# Patient Record
Sex: Female | Born: 1955 | Race: Black or African American | Hispanic: No | Marital: Single | Smoking: Never smoker
Health system: Southern US, Community
[De-identification: ages and names within clinical notes are randomized; demographics above are authoritative.]

## PROBLEM LIST (undated history)

## (undated) DIAGNOSIS — S065XAA Traumatic subdural hemorrhage with loss of consciousness status unknown, initial encounter: Secondary | ICD-10-CM

## (undated) DIAGNOSIS — G40909 Epilepsy, unspecified, not intractable, without status epilepticus: Secondary | ICD-10-CM

## (undated) DIAGNOSIS — K219 Gastro-esophageal reflux disease without esophagitis: Secondary | ICD-10-CM

## (undated) DIAGNOSIS — Z950 Presence of cardiac pacemaker: Secondary | ICD-10-CM

## (undated) DIAGNOSIS — S065X9A Traumatic subdural hemorrhage with loss of consciousness of unspecified duration, initial encounter: Secondary | ICD-10-CM

## (undated) DIAGNOSIS — I2510 Atherosclerotic heart disease of native coronary artery without angina pectoris: Secondary | ICD-10-CM

## (undated) DIAGNOSIS — I4891 Unspecified atrial fibrillation: Secondary | ICD-10-CM

## (undated) DIAGNOSIS — I509 Heart failure, unspecified: Secondary | ICD-10-CM

## (undated) DIAGNOSIS — I519 Heart disease, unspecified: Secondary | ICD-10-CM

## (undated) DIAGNOSIS — I10 Essential (primary) hypertension: Secondary | ICD-10-CM

## (undated) DIAGNOSIS — E785 Hyperlipidemia, unspecified: Secondary | ICD-10-CM

## (undated) DIAGNOSIS — F0390 Unspecified dementia without behavioral disturbance: Secondary | ICD-10-CM

## (undated) DIAGNOSIS — D649 Anemia, unspecified: Secondary | ICD-10-CM

## (undated) HISTORY — DX: Traumatic subdural hemorrhage with loss of consciousness of unspecified duration, initial encounter: S06.5X9A

## (undated) HISTORY — DX: Anemia, unspecified: D64.9

## (undated) HISTORY — DX: Gastro-esophageal reflux disease without esophagitis: K21.9

## (undated) HISTORY — DX: Epilepsy, unspecified, not intractable, without status epilepticus: G40.909

## (undated) HISTORY — DX: Essential (primary) hypertension: I10

## (undated) HISTORY — DX: Atherosclerotic heart disease of native coronary artery without angina pectoris: I25.10

## (undated) HISTORY — DX: Heart disease, unspecified: I51.9

## (undated) HISTORY — DX: Unspecified atrial fibrillation: I48.91

## (undated) HISTORY — DX: Unspecified dementia, unspecified severity, without behavioral disturbance, psychotic disturbance, mood disturbance, and anxiety: F03.90

## (undated) HISTORY — DX: Presence of cardiac pacemaker: Z95.0

## (undated) HISTORY — DX: Heart failure, unspecified: I50.9

## (undated) HISTORY — DX: Hyperlipidemia, unspecified: E78.5

## (undated) HISTORY — DX: Traumatic subdural hemorrhage with loss of consciousness status unknown, initial encounter: S06.5XAA

---

## 2014-08-09 ENCOUNTER — Emergency Department
Admit: 2014-08-09 | Disposition: A | Discharge status: Home / Self Care | Payer: Self-pay | Admitting: Emergency Medicine

## 2014-08-09 LAB — BASIC METABOLIC PANEL
ANION GAP: 6 — AB (ref 7–16)
BUN: 19 mg/dL
CALCIUM: 9.2 mg/dL
Chloride: 104 mmol/L
Co2: 29 mmol/L
Creatinine: 1.19 mg/dL — ABNORMAL HIGH
EGFR (Non-African Amer.): 50 — ABNORMAL LOW
GFR CALC AF AMER: 57 — AB
Glucose: 100 mg/dL — ABNORMAL HIGH
Potassium: 4.1 mmol/L
SODIUM: 139 mmol/L

## 2014-08-09 LAB — CBC
HCT: 40.8 % (ref 35.0–47.0)
HGB: 13 g/dL (ref 12.0–16.0)
MCH: 28.1 pg (ref 26.0–34.0)
MCHC: 31.8 g/dL — AB (ref 32.0–36.0)
MCV: 88 fL (ref 80–100)
PLATELETS: 80 10*3/uL — AB (ref 150–440)
RBC: 4.62 10*6/uL (ref 3.80–5.20)
RDW: 15.2 % — ABNORMAL HIGH (ref 11.5–14.5)
WBC: 3.3 10*3/uL — AB (ref 3.6–11.0)

## 2018-04-09 ENCOUNTER — Encounter: Payer: Self-pay | Admitting: *Deleted

## 2018-04-10 ENCOUNTER — Encounter: Payer: Self-pay | Admitting: Diagnostic Neuroimaging

## 2018-04-10 ENCOUNTER — Ambulatory Visit (INDEPENDENT_AMBULATORY_CARE_PROVIDER_SITE_OTHER): Payer: Medicare Other | Admitting: Diagnostic Neuroimaging

## 2018-04-10 VITALS — BP 122/76 | HR 60

## 2018-04-10 DIAGNOSIS — F0150 Vascular dementia without behavioral disturbance: Secondary | ICD-10-CM | POA: Diagnosis not present

## 2018-04-10 DIAGNOSIS — G40909 Epilepsy, unspecified, not intractable, without status epilepticus: Secondary | ICD-10-CM | POA: Diagnosis not present

## 2018-04-10 NOTE — Progress Notes (Signed)
[facility]  PATIENT: Ana Collins DOB: [birth year]  REFERRING CLINICIAN: Vlastimil Smetka HISTORY FROM: patient, caregiver and chart review REASON FOR VISIT: new consult    HISTORICAL  CHIEF COMPLAINT:  Chief Complaint  Patient presents with  . Seizures    rm 7, caregiver- Burna MortimerWanda, pt resides at [facility]    HISTORY OF PRESENT ILLNESS:   62 year old female with history of coronary disease, anemia, thrombocytopenia, splenomegaly, stroke with left-sided weakness and spasticity, diastolic heart failure, obesity, hyperlipidemia, pulmonary hypertension, A. fib, here for evaluation of seizure disorder.  On 01/22/2018 patient was admitted to the hospital with subdural hematoma and seizures.  She was in the intensive care unit.  She was found to have a 1 cm acute subdural hematoma with no mass-effect or midline shift.  Patient was monitored conservatively.  Anticoagulation was reversed.  She was started on Keppra and phenobarbital.  Since that time patient has been transferred to skilled nursing facility.  Patient is stable.  No recurrent seizures.  REVIEW OF SYSTEMS: Full 14 system review of systems performed and negative with exception of: Fatigue dizziness memory loss seizure agitation cramps neck pain cough.   ALLERGIES: Allergies  Allergen Reactions  . Latex   . Penicillins     HOME MEDICATIONS: Outpatient Medications Prior to Visit  Medication Sig Dispense Refill  . acetaminophen (TYLENOL) 325 MG tablet Take 650 mg by mouth every 6 (six) hours as needed.    . cloNIDine (CATAPRES) 0.1 MG tablet Take 0.1 mg by mouth 3 (three) times daily.    Marland Kitchen. donepezil (ARICEPT) 10 MG tablet Take 10 mg by mouth at bedtime.    . Fe Fum-FA-B Cmp-C-Zn-Mg-Mn-Cu (HEMOCYTE-PLUS PO) Take 1 capsule by mouth 2 (two) times daily.    . furosemide (LASIX) 80 MG tablet Take 80 mg by mouth daily.    Marland Kitchen. levETIRAcetam (KEPPRA) 100 MG/ML solution Take 10 mLs by mouth 2 (two) times  daily.    Marland Kitchen. LORazepam (ATIVAN) 0.5 MG tablet Take 0.5 mg by mouth every 8 (eight) hours.    Marland Kitchen. omeprazole (PRILOSEC) 20 MG capsule Take 20 mg by mouth daily.    . ondansetron (ZOFRAN) 8 MG tablet Take by mouth every 8 (eight) hours as needed for nausea or vomiting.    Marland Kitchen. PHENobarbital (LUMINAL) 64.8 MG tablet Take 64.8 mg by mouth at bedtime.    . potassium chloride (K-DUR) 10 MEQ tablet Take 20 mEq by mouth daily.    . pravastatin (PRAVACHOL) 80 MG tablet Take 80 mg by mouth daily.    . sertraline (ZOLOFT) 50 MG tablet Take 50 mg by mouth daily.    . sucralfate (CARAFATE) 1 g tablet Take 1 g by mouth 4 (four) times daily -  with meals and at bedtime.    Marland Kitchen. Umeclidinium-Vilanterol (ANORO ELLIPTA IN) Inhale 1 puff into the lungs daily.    . White Petrolatum-Mineral Oil (ARTIFICIAL TEARS) ointment as needed.    Marland Kitchen. losartan (COZAAR) 25 MG tablet Take 25 mg by mouth 2 (two) times daily.     No facility-administered medications prior to visit.     PAST MEDICAL HISTORY: Past Medical History:  Diagnosis Date  . Anemia   . Atrial fibrillation (HCC)   . CHF (congestive heart failure) (HCC)    diastolic  . CVD (cardiovascular disease)   . Dementia (HCC)    vascular  . Epilepsy (HCC)   . GERD (gastroesophageal reflux disease)   . Heart disease    chronic ischemic  .  Hyperlipidemia   . Hypertension   . Pacemaker   . Traumatic subdural hemorrhage (HCC)    w/o LOC    PAST SURGICAL HISTORY: History reviewed. No pertinent surgical history.  FAMILY HISTORY: Family History  Problem Relation Age of Onset  . CVA Mother     SOCIAL HISTORY: Social History   Socioeconomic History  . Marital status: Single    Spouse name: Not on file  . Number of children: 1  . Years of education: 25  . Highest education level: Not on file  Occupational History  . Not on file  Social Needs  . Financial resource strain: Not on file  . Food insecurity:    Worry: Not on file    Inability: Not on file    . Transportation needs:    Medical: Not on file    Non-medical: Not on file  Tobacco Use  . Smoking status: Never Smoker  . Smokeless tobacco: Never Used  Substance and Sexual Activity  . Alcohol use: Never    Frequency: Never  . Drug use: Never  . Sexual activity: Not on file  Lifestyle  . Physical activity:    Days per week: Not on file    Minutes per session: Not on file  . Stress: Not on file  Relationships  . Social connections:    Talks on phone: Not on file    Gets together: Not on file    Attends religious service: Not on file    Active member of club or organization: Not on file    Attends meetings of clubs or organizations: Not on file    Relationship status: Not on file  . Intimate partner violence:    Fear of current or ex partner: Not on file    Emotionally abused: Not on file    Physically abused: Not on file    Forced sexual activity: Not on file  Other Topics Concern  . Not on file  Social History Narrative   04/10/18 residing at [facility]     PHYSICAL EXAM  GENERAL EXAM/CONSTITUTIONAL: Vitals:  Vitals:   04/10/18 0950  BP: 122/76  Pulse: 60     There is no height or weight on file to calculate BMI. Wt Readings from Last 3 Encounters:  No data found for Wt     Patient is in no distress; well developed, nourished and groomed; neck is supple  CARDIOVASCULAR:  Examination of carotid arteries is normal; no carotid bruits  Regular rate and rhythm, no murmurs  Examination of peripheral vascular system by observation and palpation is normal  EYES:  Ophthalmoscopic exam of optic discs and posterior segments is normal; no papilledema or hemorrhages  No exam data present  MUSCULOSKELETAL:  Gait, strength, tone, movements noted in Neurologic exam below  NEUROLOGIC: MENTAL STATUS:  No flowsheet data found.  awake, alert, oriented to person; time --> "04/19/19"; place --> "dont know"  DECR memory   DECR attention and  concentration  SLOW RESPONSES; DECR FLUENCY; comprehension intact, naming intact  fund of knowledge appropriate  CRANIAL NERVE:   2nd - no papilledema on fundoscopic exam  2nd, 3rd, 4th, 6th - pupils equal and reactive to light, visual fields full to confrontation, extraocular muscles intact, no nystagmus  5th - facial sensation symmetric  7th - facial strength --> DECR LEFT LOWER  8th - hearing intact  9th - palate elevates symmetrically, uvula midline  11th - shoulder shrug symmetric  12th - tongue protrusion midline  HOARSE VOICE  MOTOR:   INCREASED TONE IN LUE --> 3/5  INCREASED TONE IN LLE --> 3-4/5  RIGHT SIDE 4+  normal bulk  POSTURAL AND ACTION TREMOR IN LUE  SENSORY:   normal and symmetric to light touch, temperature, vibration  COORDINATION:   finger-nose-finger, fine finger movements SLOW ON LEFT  REFLEXES:   deep tendon reflexes --> BRISK ON LEFT ARM AND LEG  GAIT/STATION:   IN WHEEL CHAIR; CANNOT STAND INDEPENDENTLY     DIAGNOSTIC DATA (LABS, IMAGING, TESTING) - I reviewed patient records, labs, notes, testing and imaging myself where available.  Lab Results  Component Value Date   WBC 3.3 (L) 08/09/2014   HGB 13.0 08/09/2014   HCT 40.8 08/09/2014   MCV 88 08/09/2014   PLT 80 (L) 08/09/2014      Component Value Date/Time   NA 139 08/09/2014 0224   K 4.1 08/09/2014 0224   CL 104 08/09/2014 0224   CO2 29 08/09/2014 0224   GLUCOSE 100 (H) 08/09/2014 0224   BUN 19 08/09/2014 0224   CREATININE 1.19 (H) 08/09/2014 0224   CALCIUM 9.2 08/09/2014 0224   GFRNONAA 50 (L) 08/09/2014 0224   GFRAA 57 (L) 08/09/2014 0224   No results found for: CHOL, HDL, LDLCALC, LDLDIRECT, TRIG, CHOLHDL No results found for: ZOXW9U No results found for: VITAMINB12 No results found for: TSH      ASSESSMENT AND PLAN  62 y.o. year old female here with:  Dx:  1. Seizure disorder (HCC)   2. Vascular dementia without behavioral disturbance  (HCC)     PLAN:  SEIZURE DISORDER (due to history of right brain stroke 2005; also acute subdural hematoma Sept 2019) - continue phenobarbital 64.8mg  at bedtime + levetiracetam 1000mg  twice a day   RIGHT BRAIN STROKE (2005) + ATRIAL FIBRILLATION - not candidate for anticoagulation anymore due to acute subdural hematoma in Sept 2019  VASCULAR DEMENTIA - continue donepezil - continue supportive care  Return if symptoms worsen or fail to improve, for return to PCP.    Suanne Marker, MD 04/10/2018, 10:06 AM Certified in Neurology, Neurophysiology and Neuroimaging  [facility] [address] [place], [place] [address] [phone]

## 2018-04-10 NOTE — Patient Instructions (Signed)
SEIZURE DISORDER (due to history of right brain stroke 2005; also acute subdural hematoma Sept 2019) - continue phenobarbital 64.8mg  at bedtime + levetiracetam 1000mg  twice a day   RIGHT BRAIN STROKE (2005) + ATRIAL FIBRILLATION - not candidate for anticoagulation anymore due to acute subdural hematoma in Sept 2019  VASCULAR DEMENTIA - continue donepezil - continue supportive care

## 2018-04-12 ENCOUNTER — Encounter: Payer: Self-pay | Admitting: Diagnostic Neuroimaging

## 2018-05-02 ENCOUNTER — Telehealth: Payer: Self-pay | Admitting: Diagnostic Neuroimaging

## 2018-05-02 NOTE — Telephone Encounter (Signed)
Ana Collins/[facility] [phone] called to advise seizures from 9am-5pm daily. The provider at [facility] added 2.34ml at noon yesterday-one time dose. Does Dr Demetrius Charity have any recommendations? Please call to advise

## 2018-05-02 NOTE — Telephone Encounter (Signed)
pls contact facility to get more information. -VRP

## 2018-05-02 NOTE — Telephone Encounter (Signed)
I tried calling x2 and got busy signal

## 2018-05-03 NOTE — Telephone Encounter (Addendum)
Called [facility], phone call transferred to [place]. Phone rang, then stopped ringing. The line went dead.  Called back, and phone rang continuously. No one ever answered.

## 2018-05-03 NOTE — Telephone Encounter (Signed)
Second attempt to reach [place]. Phone never answered at [facility].

## 2018-05-03 NOTE — Telephone Encounter (Signed)
Attempted to reach [facility],  phone continuously rang.

## 2018-05-03 NOTE — Telephone Encounter (Signed)
Recommend to increase levetiracetam to 1500mg  twice a day. -VRP

## 2018-05-03 NOTE — Telephone Encounter (Signed)
Called [place], [place] not available. Spoke with Selena Batten, nurse who stated on 05/01/18 the patient was given an extra 250 mg keppra for seizure activity. The Dr saw her yesterday, and her mouth was twitching, but no LOC. Selena Batten stated this does not occur all day but sporadically each day. She stated the  Dr considered adding third dose of ativan but did not order it. The patient is getting PT and was too drowsy for therapy when given ativan three times a day in past.  Selena Batten stated the dr wants Dr Richrd Humbles input as to any medication adjustments he feels are needed. This RN advised will let her know Dr Richrd Humbles response, recommendations. Kim verbalized understanding.

## 2018-05-06 NOTE — Telephone Encounter (Signed)
Called [facility]. Previous nurses this RN spoke with were not available. Spoke with Dayle Points LPN and advised her of Dr Richrd Humbles recommendation for levetiracetam increase. She wrote down and repeated information correctly , stated she would inform patient's nurse, thanked this RN for call.

## 2018-06-01 ENCOUNTER — Inpatient Hospital Stay (HOSPITAL_COMMUNITY)
Admission: EM | Admit: 2018-06-01 | Discharge: 2018-06-07 | DRG: 065 | Disposition: A | Discharge status: Skilled Nursing Facility | Payer: Medicare Other | Attending: Internal Medicine | Admitting: Internal Medicine

## 2018-06-01 ENCOUNTER — Inpatient Hospital Stay (HOSPITAL_COMMUNITY): Payer: Medicare Other

## 2018-06-01 ENCOUNTER — Encounter (HOSPITAL_COMMUNITY): Payer: Self-pay | Admitting: Emergency Medicine

## 2018-06-01 ENCOUNTER — Emergency Department (HOSPITAL_COMMUNITY): Payer: Medicare Other

## 2018-06-01 ENCOUNTER — Other Ambulatory Visit: Payer: Self-pay

## 2018-06-01 DIAGNOSIS — Z91040 Latex allergy status: Secondary | ICD-10-CM

## 2018-06-01 DIAGNOSIS — Z7982 Long term (current) use of aspirin: Secondary | ICD-10-CM

## 2018-06-01 DIAGNOSIS — B961 Klebsiella pneumoniae [K. pneumoniae] as the cause of diseases classified elsewhere: Secondary | ICD-10-CM | POA: Diagnosis present

## 2018-06-01 DIAGNOSIS — R1310 Dysphagia, unspecified: Secondary | ICD-10-CM | POA: Diagnosis present

## 2018-06-01 DIAGNOSIS — I5032 Chronic diastolic (congestive) heart failure: Secondary | ICD-10-CM | POA: Diagnosis present

## 2018-06-01 DIAGNOSIS — K219 Gastro-esophageal reflux disease without esophagitis: Secondary | ICD-10-CM | POA: Diagnosis present

## 2018-06-01 DIAGNOSIS — J449 Chronic obstructive pulmonary disease, unspecified: Secondary | ICD-10-CM | POA: Diagnosis present

## 2018-06-01 DIAGNOSIS — G9340 Encephalopathy, unspecified: Secondary | ICD-10-CM

## 2018-06-01 DIAGNOSIS — E785 Hyperlipidemia, unspecified: Secondary | ICD-10-CM | POA: Diagnosis present

## 2018-06-01 DIAGNOSIS — I959 Hypotension, unspecified: Secondary | ICD-10-CM | POA: Diagnosis present

## 2018-06-01 DIAGNOSIS — N179 Acute kidney failure, unspecified: Secondary | ICD-10-CM | POA: Diagnosis present

## 2018-06-01 DIAGNOSIS — S065X9A Traumatic subdural hemorrhage with loss of consciousness of unspecified duration, initial encounter: Secondary | ICD-10-CM | POA: Diagnosis not present

## 2018-06-01 DIAGNOSIS — I4891 Unspecified atrial fibrillation: Secondary | ICD-10-CM | POA: Diagnosis present

## 2018-06-01 DIAGNOSIS — I69354 Hemiplegia and hemiparesis following cerebral infarction affecting left non-dominant side: Secondary | ICD-10-CM | POA: Diagnosis not present

## 2018-06-01 DIAGNOSIS — E870 Hyperosmolality and hypernatremia: Secondary | ICD-10-CM

## 2018-06-01 DIAGNOSIS — E871 Hypo-osmolality and hyponatremia: Secondary | ICD-10-CM | POA: Diagnosis not present

## 2018-06-01 DIAGNOSIS — R569 Unspecified convulsions: Secondary | ICD-10-CM | POA: Diagnosis not present

## 2018-06-01 DIAGNOSIS — R4182 Altered mental status, unspecified: Secondary | ICD-10-CM

## 2018-06-01 DIAGNOSIS — R1319 Other dysphagia: Secondary | ICD-10-CM | POA: Diagnosis not present

## 2018-06-01 DIAGNOSIS — N390 Urinary tract infection, site not specified: Secondary | ICD-10-CM

## 2018-06-01 DIAGNOSIS — Z79899 Other long term (current) drug therapy: Secondary | ICD-10-CM | POA: Diagnosis not present

## 2018-06-01 DIAGNOSIS — D696 Thrombocytopenia, unspecified: Secondary | ICD-10-CM | POA: Diagnosis present

## 2018-06-01 DIAGNOSIS — E860 Dehydration: Secondary | ICD-10-CM | POA: Diagnosis present

## 2018-06-01 DIAGNOSIS — D649 Anemia, unspecified: Secondary | ICD-10-CM | POA: Diagnosis present

## 2018-06-01 DIAGNOSIS — I110 Hypertensive heart disease with heart failure: Secondary | ICD-10-CM | POA: Diagnosis present

## 2018-06-01 DIAGNOSIS — R29810 Facial weakness: Secondary | ICD-10-CM | POA: Diagnosis present

## 2018-06-01 DIAGNOSIS — I6203 Nontraumatic chronic subdural hemorrhage: Secondary | ICD-10-CM

## 2018-06-01 DIAGNOSIS — S065XAA Traumatic subdural hemorrhage with loss of consciousness status unknown, initial encounter: Secondary | ICD-10-CM

## 2018-06-01 DIAGNOSIS — I6201 Nontraumatic acute subdural hemorrhage: Secondary | ICD-10-CM | POA: Diagnosis present

## 2018-06-01 DIAGNOSIS — Z880 Allergy status to penicillin: Secondary | ICD-10-CM

## 2018-06-01 DIAGNOSIS — G40909 Epilepsy, unspecified, not intractable, without status epilepticus: Secondary | ICD-10-CM | POA: Diagnosis present

## 2018-06-01 DIAGNOSIS — F0150 Vascular dementia without behavioral disturbance: Secondary | ICD-10-CM | POA: Diagnosis present

## 2018-06-01 DIAGNOSIS — I2510 Atherosclerotic heart disease of native coronary artery without angina pectoris: Secondary | ICD-10-CM | POA: Diagnosis present

## 2018-06-01 DIAGNOSIS — Z950 Presence of cardiac pacemaker: Secondary | ICD-10-CM

## 2018-06-01 DIAGNOSIS — Z823 Family history of stroke: Secondary | ICD-10-CM

## 2018-06-01 LAB — COMPREHENSIVE METABOLIC PANEL
ALBUMIN: 2.8 g/dL — AB (ref 3.5–5.0)
ALK PHOS: 119 U/L (ref 38–126)
ALT: 22 U/L (ref 0–44)
AST: 53 U/L — ABNORMAL HIGH (ref 15–41)
Anion gap: 14 (ref 5–15)
BILIRUBIN TOTAL: 1 mg/dL (ref 0.3–1.2)
BUN: 20 mg/dL (ref 8–23)
CALCIUM: 9.6 mg/dL (ref 8.9–10.3)
CO2: 24 mmol/L (ref 22–32)
Chloride: 114 mmol/L — ABNORMAL HIGH (ref 98–111)
Creatinine, Ser: 1.65 mg/dL — ABNORMAL HIGH (ref 0.44–1.00)
GFR calc Af Amer: 38 mL/min — ABNORMAL LOW (ref >60)
GFR calc non Af Amer: 32 mL/min — ABNORMAL LOW (ref >60)
GLUCOSE: 97 mg/dL (ref 70–99)
Potassium: 3.7 mmol/L (ref 3.5–5.1)
Sodium: 152 mmol/L — ABNORMAL HIGH (ref 135–145)
TOTAL PROTEIN: 9 g/dL — AB (ref 6.5–8.1)

## 2018-06-01 LAB — URINALYSIS, ROUTINE W REFLEX MICROSCOPIC
Bilirubin Urine: NEGATIVE
Glucose, UA: NEGATIVE mg/dL
Ketones, ur: NEGATIVE mg/dL
Nitrite: NEGATIVE
Protein, ur: 30 mg/dL — AB
Specific Gravity, Urine: 1.01 (ref 1.005–1.030)
pH: 6 (ref 5.0–8.0)

## 2018-06-01 LAB — CBC WITH DIFFERENTIAL/PLATELET
ABS IMMATURE GRANULOCYTES: 0.03 10*3/uL (ref 0.00–0.07)
Basophils Absolute: 0.1 10*3/uL (ref 0.0–0.1)
Basophils Relative: 1 %
EOS ABS: 0.3 10*3/uL (ref 0.0–0.5)
Eosinophils Relative: 4 %
HCT: 52 % — ABNORMAL HIGH (ref 36.0–46.0)
Hemoglobin: 15.8 g/dL — ABNORMAL HIGH (ref 12.0–15.0)
IMMATURE GRANULOCYTES: 0 %
Lymphocytes Relative: 30 %
Lymphs Abs: 2 10*3/uL (ref 0.7–4.0)
MCH: 27.4 pg (ref 26.0–34.0)
MCHC: 30.4 g/dL (ref 30.0–36.0)
MCV: 90.1 fL (ref 80.0–100.0)
MONOS PCT: 17 %
Monocytes Absolute: 1.2 10*3/uL — ABNORMAL HIGH (ref 0.1–1.0)
NEUTROS ABS: 3.2 10*3/uL (ref 1.7–7.7)
Neutrophils Relative %: 48 %
PLATELETS: 92 10*3/uL — AB (ref 150–400)
RBC: 5.77 MIL/uL — ABNORMAL HIGH (ref 3.87–5.11)
RDW: 19 % — ABNORMAL HIGH (ref 11.5–15.5)
WBC: 6.7 10*3/uL (ref 4.0–10.5)
nRBC: 0 % (ref 0.0–0.2)

## 2018-06-01 LAB — URINALYSIS, MICROSCOPIC (REFLEX): WBC, UA: >50 WBC/hpf (ref 0–5)

## 2018-06-01 LAB — I-STAT TROPONIN, ED: Troponin i, poc: 0.01 ng/mL (ref 0.00–0.08)

## 2018-06-01 LAB — BASIC METABOLIC PANEL
Anion gap: 10 (ref 5–15)
BUN: 17 mg/dL (ref 8–23)
CO2: 22 mmol/L (ref 22–32)
CREATININE: 1.34 mg/dL — AB (ref 0.44–1.00)
Calcium: 8.8 mg/dL — ABNORMAL LOW (ref 8.9–10.3)
Chloride: 120 mmol/L — ABNORMAL HIGH (ref 98–111)
GFR calc Af Amer: 48 mL/min — ABNORMAL LOW (ref >60)
GFR, EST NON AFRICAN AMERICAN: 42 mL/min — AB (ref >60)
Glucose, Bld: 84 mg/dL (ref 70–99)
Potassium: 3.6 mmol/L (ref 3.5–5.1)
Sodium: 152 mmol/L — ABNORMAL HIGH (ref 135–145)

## 2018-06-01 LAB — MRSA PCR SCREENING: MRSA by PCR: NEGATIVE

## 2018-06-01 LAB — LACTIC ACID, PLASMA
Lactic Acid, Venous: 3.1 mmol/L (ref 0.5–1.9)
Lactic Acid, Venous: 3.4 mmol/L (ref 0.5–1.9)
Lactic Acid, Venous: 2.1 mmol/L (ref 0.5–1.9)

## 2018-06-01 LAB — PHENOBARBITAL LEVEL: Phenobarbital: 27.1 ug/mL (ref 15.0–30.0)

## 2018-06-01 LAB — CBG MONITORING, ED: Glucose-Capillary: 89 mg/dL (ref 70–99)

## 2018-06-01 MED ORDER — SODIUM CHLORIDE 0.9% FLUSH
10.0000 mL | Freq: Two times a day (BID) | INTRAVENOUS | Status: DC
  Administered 2018-06-02: 20 mL
  Administered 2018-06-02 – 2018-06-06 (×4): 10 mL
  Administered 2018-06-07: 40 mL

## 2018-06-01 MED ORDER — UMECLIDINIUM-VILANTEROL 62.5-25 MCG/INH IN AEPB
1.0000 | INHALATION_SPRAY | Freq: Every day | RESPIRATORY_TRACT | Status: DC
  Administered 2018-06-01: 1 via RESPIRATORY_TRACT
  Filled 2018-06-01: qty 14

## 2018-06-01 MED ORDER — SODIUM CHLORIDE 0.9% FLUSH: 10.0000 mL | INTRAVENOUS | Status: DC | PRN

## 2018-06-01 MED ORDER — SODIUM CHLORIDE 0.9 % IV SOLN
INTRAVENOUS | Status: DC
  Administered 2018-06-01: 05:00:00 via INTRAVENOUS

## 2018-06-01 MED ORDER — POLYVINYL ALCOHOL 1.4 % OP SOLN
1.0000 [drp] | Freq: Three times a day (TID) | OPHTHALMIC | Status: DC
  Administered 2018-06-01 – 2018-06-07 (×26): 1 [drp] via OPHTHALMIC
  Filled 2018-06-01: qty 15

## 2018-06-01 MED ORDER — LEVETIRACETAM IN NACL 1500 MG/100ML IV SOLN
1500.0000 mg | Freq: Two times a day (BID) | INTRAVENOUS | Status: DC
  Administered 2018-06-01 – 2018-06-06 (×11): 1500 mg via INTRAVENOUS
  Filled 2018-06-01 (×12): qty 100

## 2018-06-01 MED ORDER — SODIUM CHLORIDE 0.9 % IV SOLN
INTRAVENOUS | Status: DC
  Administered 2018-06-01: 11:00:00 via INTRAVENOUS

## 2018-06-01 MED ORDER — SODIUM CHLORIDE 0.9 % IV BOLUS
1000.0000 mL | Freq: Once | INTRAVENOUS | Status: AC
  Administered 2018-06-01: 1000 mL via INTRAVENOUS

## 2018-06-01 MED ORDER — ACETAMINOPHEN 325 MG PO TABS: 650.0000 mg | ORAL_TABLET | Freq: Four times a day (QID) | ORAL | Status: DC | PRN

## 2018-06-01 MED ORDER — LEVOFLOXACIN IN D5W 750 MG/150ML IV SOLN
750.0000 mg | Freq: Once | INTRAVENOUS | Status: AC
  Administered 2018-06-01: 750 mg via INTRAVENOUS
  Filled 2018-06-01: qty 150

## 2018-06-01 MED ORDER — PHENOBARBITAL SODIUM 65 MG/ML IJ SOLN
65.0000 mg | Freq: Every day | INTRAMUSCULAR | Status: DC
  Administered 2018-06-01 – 2018-06-05 (×5): 65 mg via INTRAVENOUS
  Filled 2018-06-01 (×5): qty 1

## 2018-06-01 MED ORDER — SODIUM CHLORIDE 0.45 % IV SOLN
INTRAVENOUS | Status: DC
  Administered 2018-06-01 – 2018-06-02 (×3): via INTRAVENOUS

## 2018-06-01 MED ORDER — DONEPEZIL HCL 10 MG PO TABS
10.0000 mg | ORAL_TABLET | Freq: Every day | ORAL | Status: DC
  Filled 2018-06-01: qty 1

## 2018-06-01 MED ORDER — ACETAMINOPHEN 650 MG RE SUPP: 650.0000 mg | Freq: Four times a day (QID) | RECTAL | Status: DC | PRN

## 2018-06-01 MED ORDER — LEVOFLOXACIN IN D5W 750 MG/150ML IV SOLN: 750.0000 mg | INTRAVENOUS | Status: DC

## 2018-06-01 MED ORDER — ONDANSETRON HCL 4 MG/2ML IJ SOLN: 4.0000 mg | Freq: Four times a day (QID) | INTRAMUSCULAR | Status: DC | PRN

## 2018-06-01 NOTE — ED Provider Notes (Signed)
[facility] EMERGENCY DEPARTMENT Provider Note   CSN: [id number] Arrival date & time: 06/01/18  0038     History   Chief Complaint No chief complaint on file.   HPI Ana Collins is a 63 y.o. female.  Patient is a 63 year old female with extensive past medical history including CHF, A. fib, dementia, hypertension, and prior stroke.  She is currently a resident of a nursing home.  According to the staff there, for the past 3 days she has been less alert and has not been eating or drinking.  This evening she was reported to have decreased urine output and low blood pressures.  They continued to have difficulty waking her and sent her here for evaluation.  The patient had no additional history secondary to prior CVA and acuity of condition.  The history is provided by the patient.    Past Medical History:  Diagnosis Date  . Anemia   . Atrial fibrillation (HCC)   . CHF (congestive heart failure) (HCC)    diastolic  . CVD (cardiovascular disease)   . Dementia (HCC)    vascular  . Epilepsy (HCC)   . GERD (gastroesophageal reflux disease)   . Heart disease    chronic ischemic  . Hyperlipidemia   . Hypertension   . Pacemaker   . Traumatic subdural hemorrhage (HCC)    w/o LOC    There are no active problems to display for this patient.   History reviewed. No pertinent surgical history.   OB History   No obstetric history on file.      Home Medications    Prior to Admission medications   Medication Sig Start Date End Date Taking? Authorizing Provider  acetaminophen (TYLENOL) 325 MG tablet Take 650 mg by mouth every 6 (six) hours as needed.    [provider]  cloNIDine (CATAPRES) 0.1 MG tablet Take 0.1 mg by mouth 3 (three) times daily.    [provider]  donepezil (ARICEPT) 10 MG tablet Take 10 mg by mouth at bedtime.    [provider]  Fe Fum-FA-B Cmp-C-Zn-Mg-Mn-Cu (HEMOCYTE-PLUS PO) Take 1 capsule by mouth 2 (two) times  daily.    [provider]  furosemide (LASIX) 80 MG tablet Take 80 mg by mouth daily.    [provider]  levETIRAcetam (KEPPRA) 100 MG/ML solution Take 10 mLs by mouth 2 (two) times daily.    [provider]  LORazepam (ATIVAN) 0.5 MG tablet Take 0.5 mg by mouth every 8 (eight) hours.    [provider]  losartan (COZAAR) 25 MG tablet Take 25 mg by mouth 2 (two) times daily.    [provider]  omeprazole (PRILOSEC) 20 MG capsule Take 20 mg by mouth daily.    [provider]  ondansetron (ZOFRAN) 8 MG tablet Take by mouth every 8 (eight) hours as needed for nausea or vomiting.    [provider]  PHENobarbital (LUMINAL) 64.8 MG tablet Take 64.8 mg by mouth at bedtime.    [provider]  potassium chloride (K-DUR) 10 MEQ tablet Take 20 mEq by mouth daily.    [provider]  pravastatin (PRAVACHOL) 80 MG tablet Take 80 mg by mouth daily.    [provider]  sertraline (ZOLOFT) 50 MG tablet Take 50 mg by mouth daily.    [provider]  sucralfate (CARAFATE) 1 g tablet Take 1 g by mouth 4 (four) times daily -  with meals and at bedtime.  [provider]  Umeclidinium-Vilanterol (ANORO ELLIPTA IN) Inhale 1 puff into the lungs daily.    [provider]  Rayburn MaWhite Petrolatum-Mineral Oil (ARTIFICIAL TEARS) ointment as needed.    [provider]    Family History Family History  Problem Relation Age of Onset  . CVA Mother     Social History Social History   Tobacco Use  . Smoking status: Never Smoker  . Smokeless tobacco: Never Used  Substance Use Topics  . Alcohol use: Never    Frequency: Never  . Drug use: Never     Allergies   Latex and Penicillins   Review of Systems Review of Systems  Unable to perform ROS: Acuity of condition     Physical Exam Updated Vital Signs There were no vitals taken for this visit.  Physical Exam Vitals signs and nursing  note reviewed.  Constitutional:      Appearance: She is well-developed.     Comments: Patient is a chronically ill-appearing female.  She has decreased level of consciousness.  HENT:     Head: Normocephalic and atraumatic.     Mouth/Throat:     Mouth: Mucous membranes are dry.  Neck:     Musculoskeletal: Normal range of motion and neck supple.  Cardiovascular:     Rate and Rhythm: Normal rate and regular rhythm.     Heart sounds: No murmur. No friction rub. No gallop.   Pulmonary:     Effort: Pulmonary effort is normal. No respiratory distress.     Breath sounds: Normal breath sounds. No wheezing.  Abdominal:     General: Bowel sounds are normal. There is no distension.     Palpations: Abdomen is soft.     Tenderness: There is no abdominal tenderness.  Musculoskeletal: Normal range of motion.        General: No swelling, tenderness or deformity.     Right lower leg: No edema.     Left lower leg: No edema.  Skin:    General: Skin is warm and dry.  Neurological:     Comments: Patient's level of consciousness is decreased.  She will attempt to respond to questions, but mainly with grunting noises that are incomprehensible.  She is not following commands.      ED Treatments / Results  Labs (all labs ordered are listed, but only abnormal results are displayed) Labs Reviewed  LACTIC ACID, PLASMA  LACTIC ACID, PLASMA  COMPREHENSIVE METABOLIC PANEL  CBC WITH DIFFERENTIAL/PLATELET  URINALYSIS, ROUTINE W REFLEX MICROSCOPIC  I-STAT TROPONIN, ED    EKG None  Radiology No results found.  Procedures Procedures (including critical care time)  Medications Ordered in ED Medications  sodium chloride 0.9 % bolus 1,000 mL (has no administration in time range)     Initial Impression / Assessment and Plan / ED Course  I have reviewed the triage vital signs and the nursing notes.  Pertinent labs & imaging results that were available during my care of the patient were reviewed  by me and considered in my medical decision making (see chart for details).  Patient with history of prior CVA and other medical history as described in the HPI.  She was sent here from her extended care facility for evaluation of decreased oral intake and increased somnolence over the past several days.  She was also found to be hypotensive and had diminished urine output.  Patient's work-up today reveals an elevated sodium and elevation of her creatinine above baseline consistent with dehydration.  She was initially hypotensive, but responded well to IV fluids.  Her blood pressures are now over 100 systolic.  She was also found to have a urinary tract infection.  She was given intravenous Levaquin for this.  Patient also underwent head CT which showed a small right-sided subdural hematoma.  This finding was discussed with Dr. Dutch QuintPoole from neurosurgery.  He did not feel as though any emergent intervention was indicated and seemed to believe that this was subacute.  Patient's medical issues will be addressed and she will be admitted to the hospitalist service for further care.  I had an extensive conversation with the patient's son and daughter-in-law over the telephone.  [place]  According to the son, the patient is a full code.  The patient son also explained to me that she has been less active and more somnolent since her Keppra dose has been increased.  She was also started on Ativan recently.  He is concerned she may be overmedicated.  CRITICAL CARE Performed by: Geoffery Lyonsouglas Javeria Briski Total critical care time: 45 minutes Critical care time was exclusive of separately billable procedures and treating other patients. Critical care was necessary to treat or prevent imminent or life-threatening deterioration. Critical care was time spent personally by me on the following activities: development of treatment plan with patient and/or surrogate as well as nursing, discussions  with consultants, evaluation of patient's response to treatment, examination of patient, obtaining history from patient or surrogate, ordering and performing treatments and interventions, ordering and review of laboratory studies, ordering and review of radiographic studies, pulse oximetry and re-evaluation of patient's condition.   Final Clinical Impressions(s) / ED Diagnoses   Final diagnoses:  None    ED Discharge Orders    None       Geoffery Lyonselo, Yarethzi Branan, MD 06/01/18 [phone]

## 2018-06-01 NOTE — Progress Notes (Signed)
[place] TEAM 1 - Stepdown/ICU TEAM  Ana Collins  CBJ:[id number] DOB: June 07, 1955 DOA: 06/01/2018 PCP: Karna Dupes, MD    Brief Narrative:  63yo F w/ a hx of vascular dementia, A. Fib, status post pacemaker, chronic diastolic CHF, hypertension, hyperlipidemia, epilepsy, GERD, CVA with residual left-sided weakness and spasticity who presented to the hospital via EMS from [place] with hypotension and altered mental status.  It was reported to EMS the patient had not been voiding.  Facility reported she had not been eating or drinking for 2 days.  Significant Events: 2/1 admit   Subjective: Pt is seen for a f/u visit.    Assessment & Plan:  Acute subdural hematomas CThead noted acute R subdural hematomas along the convexity of the brain, falx and right tentorium measuring up to 4.3 mm in thickness w/ no midline shift - ED discussed w/ NS w/ no current need for intervention   UTI UA with large amount of leukocytes and greater than 50 WBCs - cont empiric abx coverage  Hypernatremia secondary to severe dehydration  Acute encephalopathy  Due to subdural hematomas, UTI, severe dehydration, and hypernatremia  Hypotension Due to severe DH  AKI Baseline crt 1.1 three years ago  Chronic thrombocytopenia 80,000 is baseline   Seizure disorder  continue home phenobarbital and Keppra IV  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 Hold home aspirin   Atrial fibrillation not a candidate for anticoagulation due to history of subdural hematomas  DVT prophylaxis: SCDs Code Status: FULL CODE Family Communication:  Disposition Plan:   Consultants:  none  Antimicrobials:  Levaquin 1/31 >   Objective: Blood pressure 92/64, pulse 77, temperature (!) 97.5 F (36.4 C), temperature source Axillary, resp. rate 17, weight 83.7 kg, SpO2 99 %.  Intake/Output Summary (Last 24 hours) at 06/01/2018 0941 Last data filed at 06/01/2018 1607 Gross  per 24 hour  Intake 1234.33 ml  Output -  Net 1234.33 ml   Filed Weights   06/01/18 0600  Weight: 83.7 kg    Examination: Pt was seen for a f/u visit.    CBC: Recent Labs  Lab 06/01/18 0103  WBC 6.7  NEUTROABS 3.2  HGB 15.8*  HCT 52.0*  MCV 90.1  PLT 92*   Basic Metabolic Panel: Recent Labs  Lab 06/01/18 0103  NA 152*  K 3.7  CL 114*  CO2 24  GLUCOSE 97  BUN 20  CREATININE 1.65*  CALCIUM 9.6   GFR: CrCl cannot be calculated (Unknown ideal weight.).  Liver Function Tests: Recent Labs  Lab 06/01/18 0103  AST 53*  ALT 22  ALKPHOS 119  BILITOT 1.0  PROT 9.0*  ALBUMIN 2.8*    CBG: Recent Labs  Lab 06/01/18 0052  GLUCAP 89    Recent Results (from the past 240 hour(s))  MRSA PCR Screening     Status: None   Collection Time: 06/01/18  5:52 AM  Result Value Ref Range Status   MRSA by PCR NEGATIVE NEGATIVE Final    Comment:        The GeneXpert MRSA Assay (FDA approved for NASAL specimens only), is one component of a comprehensive MRSA colonization surveillance program. It is not intended to diagnose MRSA infection nor to guide or monitor treatment for MRSA infections. Performed at [facility], [address] [place], [place] [address]      Scheduled Meds: . PHENObarbital  65 mg Intravenous QHS  . polyvinyl alcohol  1 drop Both Eyes  TID AC & HS  . umeclidinium-vilanterol  1 puff Inhalation Daily     LOS: 0 days   Time spent: No Charge  Lonia Blood, MD [facility] Office  [phone] Pager - Text Page per Amion as per below:  On-Call/Text Page:      [email or web address]  If 7PM-7AM, please contact night-coverage [email or web address] 06/01/2018, 9:41 AM

## 2018-06-01 NOTE — ED Triage Notes (Signed)
Pt arrived [facility] from [facility] for hypotension and AMS. EMS called out for inability to void. Facility reports no eating or drinking for the last 2 days,and that they attempt oral intake but pt did not tolerate, hx of stroke with L sided paralysis. facility also states pt is normally verbal and today she is not BP 88/54 P Paced 60-80s O2 96% CBG 126

## 2018-06-01 NOTE — Evaluation (Signed)
Clinical/Bedside Swallow Evaluation Patient Details  Name: Ana BarrenLinda Zajkowski MRN: [id number] Date of Birth: 09-28-1955  Today's Date: 06/01/2018 Time: SLP Start Time (ACUTE ONLY): 0950 SLP Stop Time (ACUTE ONLY): 1005 SLP Time Calculation (min) (ACUTE ONLY): 15 min  Past Medical History:  Past Medical History:  Diagnosis Date  . Anemia   . Atrial fibrillation (HCC)   . CHF (congestive heart failure) (HCC)    diastolic  . CVD (cardiovascular disease)   . Dementia (HCC)    vascular  . Epilepsy (HCC)   . GERD (gastroesophageal reflux disease)   . Heart disease    chronic ischemic  . Hyperlipidemia   . Hypertension   . Pacemaker   . Traumatic subdural hemorrhage (HCC)    w/o LOC   Past Surgical History: History reviewed. No pertinent surgical history. HPI:  63yo F w/ a hx of vascular dementia, A. 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  It was reported to EMS the patient had not been voiding.  Facility reported she had not been eating or drinking for 2 days. CT of the head noted acute right subdural hematomas. Also with UTI.    Assessment / Plan / Recommendation Clinical Impression  Patient presents with what appears to be a cognitively based oropharyngeal dysphagia characterized by poor awareness of bolus, prolonged oral transit, and oral holding/residuals (with solids). Ability to orally transit liquids inconsistent with max verbal and tactile cueing and patient with inconsistent throat clearing post swallow suggestive of decreased airway protection. Note that today, patient minimally verbal and requires max cueing to follow directions. Prognosis for ability to resume pos good with improving mentation a SLP suspects that patient with a normal oropharyngeal swallow at baseline.  SLP  Visit Diagnosis: Dysphagia, oropharyngeal phase (R13.12)    Aspiration Risk  Severe aspiration risk    Diet Recommendation NPO   Medication Administration: Via alternative means    Other  Recommendations Oral Care Recommendations: Oral care QID   Follow up Recommendations None      Frequency and Duration min 2x/week  2 weeks       Prognosis Prognosis for Safe Diet Advancement: Good      Swallow Study   General HPI: 63yo F w/ a hx of vascular dementia, A. 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  It was reported to EMS the patient had not been voiding.  Facility reported she had not been eating or drinking for 2 days. CT of the head noted acute right subdural hematomas. Also with UTI.  Type of Study: Bedside Swallow Evaluation Previous Swallow Assessment: none Diet Prior to this Study: NPO Temperature Spikes Noted: No Respiratory Status: Room air History of Recent Intubation: No Behavior/Cognition: Lethargic/Drowsy;Doesn't follow directions;Requires cueing Oral Cavity Assessment: Dry;Dried secretions Oral Care Completed by SLP: Yes Vision: Functional for self-feeding Self-Feeding Abilities: Needs assist Patient Positioning: Upright in bed Baseline Vocal Quality: Normal(limited verbal output) Volitional Cough: Cognitively unable to elicit Volitional Swallow: Unable to elicit    Oral/Motor/Sensory Function Overall Oral Motor/Sensory Function: Other (comment)(unable to formally assess)   Ice Chips Ice chips: Impaired Presentation: Spoon Oral Phase Impairments: Impaired mastication Oral Phase Functional Implications: Prolonged oral transit Pharyngeal Phase Impairments: Throat Clearing -  Delayed   Thin Liquid Thin Liquid: Impaired Presentation: Straw Oral Phase  Impairments: Poor awareness of bolus Pharyngeal  Phase Impairments: Multiple swallows;Throat Clearing - Delayed;Throat Clearing - Immediate    Nectar Thick Nectar Thick Liquid: Not tested   Honey Thick Honey Thick Liquid: Not tested   Puree Puree: Impaired Presentation: Spoon Oral Phase Impairments: Impaired mastication;Poor awareness of bolus Oral Phase Functional Implications: Oral holding;Oral residue   Solid     Solid: Not tested     Ferdinand Lango MA, CCC-SLP   Kelly Eisler Meryl 06/01/2018,10:24 AM

## 2018-06-01 NOTE — H&P (Signed)
History and Physical    [name] ZOX:[id number] DOB: June 05, 1955 DOA: 06/01/2018  PCP: Karna Dupes, MD  Chief Complaint: Hypotension, AMS  HPI: Ana Collins is a 63 y.o. female with medical history significant of vascular dementia, A. 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  It was reported to EMS that patient has not been voiding.  Facility reported she has not been eating or drinking for the last 2 days.  She is normally verbal but today is not.  Blood pressure 88/54 per triage.  Patient is able to tell me her name is Ana Collins.  No additional history could be obtained from her.  Review of Systems: As per HPI otherwise 10 point review of systems negative.  Past Medical History:  Diagnosis Date  . Anemia   . Atrial fibrillation (HCC)   . CHF (congestive heart failure) (HCC)    diastolic  . CVD (cardiovascular disease)   . Dementia (HCC)    vascular  . Epilepsy (HCC)   . GERD (gastroesophageal reflux disease)   . Heart disease    chronic ischemic  . Hyperlipidemia   . Hypertension   . Pacemaker   . Traumatic subdural hemorrhage (HCC)    w/o LOC    History reviewed. No pertinent surgical history.   reports that she has never smoked. She has never used smokeless tobacco. She reports that she does not drink alcohol or use drugs.  Allergies  Allergen Reactions  . Latex Other (See Comments)    ON MAR  . Penicillins Other (See Comments)    ON MAR    Family History  Problem Relation Age of Onset  . CVA Mother     Prior to Admission medications   Medication Sig Start Date End Date Taking? Authorizing Provider  acetaminophen (TYLENOL) 325 MG tablet Take 650 mg by mouth 2 (two) times daily.    Yes [provider]  aspirin 325 MG tablet  Take 325 mg by mouth daily.   Yes [provider]  B Complex-C (B-COMPLEX WITH VITAMIN C) tablet Take 1 tablet by mouth daily.   Yes [provider]  carboxymethylcellul-glycerin (REFRESH OPTIVE) 0.5-0.9 % ophthalmic solution Place 1 drop into both eyes 4 (four) times daily.   Yes [provider]  cloNIDine (CATAPRES) 0.1 MG tablet Take 0.1 mg by mouth 3 (three) times daily.   Yes [provider]  donepezil (ARICEPT) 10 MG tablet Take 10 mg by mouth at bedtime.   Yes [provider]  Fe Fum-FA-B Cmp-C-Zn-Mg-Mn-Cu (HEMOCYTE-PLUS PO) Take 1 capsule by mouth 2 (two) times daily.   Yes [provider]  furosemide (LASIX) 80 MG tablet Take 60 mg by mouth daily.    Yes [provider]  levETIRAcetam (KEPPRA) 100 MG/ML solution Take 15 mLs by mouth 2 (two) times daily.    Yes [provider]  LORazepam (ATIVAN) 0.5 MG tablet Take 0.5 mg by mouth 2 (two) times daily.    Yes [provider]  losartan (COZAAR) 25 MG tablet Take 25 mg by mouth daily.    Yes [provider]  Multiple Vitamin (MULTIVITAMIN WITH MINERALS) TABS tablet Take 1 tablet by mouth daily.   Yes [provider]  Nutritional Supplements (RESOURCE 2.0 PO) Take 1 Can by mouth 3 (three) times daily between meals.  Yes [provider]  omeprazole (PRILOSEC) 20 MG capsule Take 20 mg by mouth daily.   Yes [provider]  ondansetron (ZOFRAN) 8 MG tablet Take by mouth every 8 (eight) hours as needed for nausea or vomiting.   Yes [provider]  PHENobarbital (LUMINAL) 64.8 MG tablet Take 64.8 mg by mouth at bedtime.   Yes [provider]  potassium chloride (K-DUR) 10 MEQ tablet Take 20 mEq by mouth daily.   Yes [provider]  pravastatin (PRAVACHOL) 80 MG tablet Take 80 mg by mouth daily.   Yes [provider]  sertraline (ZOLOFT) 50 MG tablet Take 50 mg by mouth daily.   Yes [provider]  sucralfate (CARAFATE) 1 g tablet Take 1 g by mouth 3 (three) times daily as needed Genella Rife(Gerd).    Yes [provider]  Umeclidinium-Vilanterol (ANORO ELLIPTA IN) Inhale 1 puff into the lungs daily.   Yes [provider]    Physical Exam: Vitals:   06/01/18 0300 06/01/18 0315 06/01/18 0330 06/01/18 0345  BP: 105/73 101/74 113/75 99/67  Pulse:  70 71 71  Resp: (!) 23 15 16 14   Temp:      TempSrc:      SpO2:  96% 100% 96%    Physical Exam  HENT:  Head: Normocephalic.  Extremely dry mucous membranes  Eyes: Right eye exhibits no discharge. Left eye exhibits no discharge.  Unable to assess pupillary reaction due to lack of patient cooperation  Neck: Neck supple.  Cardiovascular: Normal rate, regular rhythm and intact distal pulses.  Pulmonary/Chest: She has no wheezes.  Equal air entry bilaterally on auscultation of anterior lung fields.  Abdominal: Soft. Bowel sounds are normal. She exhibits no distension. There is no abdominal tenderness. There is no guarding.  Musculoskeletal:        General: No edema.  Neurological:  Awake Oriented to person only Wiggles toes (right lower extremity only) on command Squeezes fingers (right upper extremity only) on command  Skin: Skin is warm and dry. She is not diaphoretic.     Labs on Admission: I have personally reviewed following labs and imaging studies  CBC: Recent Labs  Lab 06/01/18 0103  WBC 6.7  NEUTROABS 3.2  HGB 15.8*  HCT 52.0*  MCV 90.1  PLT 92*   Basic Metabolic Panel: Recent Labs  Lab 06/01/18 0103  NA 152*  K 3.7  CL 114*  CO2 24  GLUCOSE 97  BUN 20  CREATININE 1.65*  CALCIUM 9.6   GFR: CrCl cannot be calculated (Unknown ideal weight.). Liver Function Tests: Recent Labs  Lab 06/01/18 0103  AST 53*  ALT 22  ALKPHOS 119  BILITOT 1.0  PROT 9.0*  ALBUMIN 2.8*   No results for input(s): LIPASE, AMYLASE in the last 168 hours. No results for input(s): AMMONIA in the last  168 hours. Coagulation Profile: No results for input(s): INR, PROTIME in the last 168 hours. Cardiac Enzymes: No results for input(s): CKTOTAL, CKMB, CKMBINDEX, TROPONINI in the last 168 hours. BNP (last 3 results) No results for input(s): PROBNP in the last 8760 hours. HbA1C: No results for input(s): HGBA1C in the last 72 hours. CBG: Recent Labs  Lab 06/01/18 0052  GLUCAP 89   Lipid Profile: No results for input(s): CHOL, HDL, LDLCALC, TRIG, CHOLHDL, LDLDIRECT in the last 72 hours. Thyroid Function Tests: No results for input(s): TSH, T4TOTAL, FREET4, T3FREE, THYROIDAB in the last 72 hours. Anemia Panel: No results for input(s): VITAMINB12, FOLATE,  FERRITIN, TIBC, IRON, RETICCTPCT in the last 72 hours. Urine analysis:    Component Value Date/Time   COLORURINE YELLOW 06/01/2018 0203   APPEARANCEUR CLOUDY (A) 06/01/2018 0203   LABSPEC 1.010 06/01/2018 0203   PHURINE 6.0 06/01/2018 0203   GLUCOSEU NEGATIVE 06/01/2018 0203   HGBUR TRACE (A) 06/01/2018 0203   BILIRUBINUR NEGATIVE 06/01/2018 0203   KETONESUR NEGATIVE 06/01/2018 0203   PROTEINUR 30 (A) 06/01/2018 0203   NITRITE NEGATIVE 06/01/2018 0203   LEUKOCYTESUR LARGE (A) 06/01/2018 0203    Radiological Exams on Admission: Dg Chest 1 View  Result Date: 06/01/2018 CLINICAL DATA:  Hypotension and altered mental status. EXAM: CHEST  1 VIEW COMPARISON:  None. FINDINGS: Cardiomegaly with aortic atherosclerosis. Hilar prominence likely representing prominent pulmonary vasculature is noted likely representing stigmata of pulmonary hypertension. No acute pulmonary consolidation, effusion or pneumothorax. Left-sided pacemaker apparatus with leads in the right atrium and right ventricle are noted. IMPRESSION: Cardiomegaly with aortic atherosclerosis. No acute pulmonary disease. Electronically Signed   By: Tollie Eth M.D.   On: 06/01/2018 01:11   Ct Head Wo Contrast  Result Date: 06/01/2018 CLINICAL DATA:  63 year old female with  hypotension and altered mental status. EXAM: CT HEAD WITHOUT CONTRAST TECHNIQUE: Contiguous axial images were obtained from the base of the skull through the vertex without intravenous contrast. COMPARISON:  None. FINDINGS: Brain: Acute right-sided subdural overlying the convexity the brain measuring up to 4.3 mm in thickness. Right parafalcine subdural hematoma is also noted measuring to 3.5 mm in thickness as well as the right tentorium measuring up to 1.4 mm. Remote appearing right temporal occipital infarct with adjacent encephalomalacia right lateral ventricle. An age-indeterminate brainstem infarct is also seen. Hyperdensities along the cerebellopontine angles compatible choroid plexus calcifications the sagittal view. Moderate degree of small vessel ischemia is otherwise noted. Age related involutional changes of brain are noted. Vascular: No hyperdense vessel sign. Skull: No acute fracture or suspicious osseous lesions. Sinuses/Orbits: Right frontal, right maxillary and ethmoid sinus mucosal thickening. Intact orbits and globes. Other: None IMPRESSION: 1. Acute right-sided subdural hematomas as above along the convexity of the brain, falx and right tentorium measuring up to 4.3 mm in thickness. 2. Chronic moderate small vessel ischemic disease. 3. Chronic right temporal occipital lobe infarct with adjacent encephalomalacia the right lateral ventricle. 4. Age-indeterminate brainstem infarct. These results were called by telephone at the time of interpretation on 06/01/2018 at 2:00 am to Dr. Geoffery Lyons , who verbally acknowledged these results. Electronically Signed   By: Tollie Eth M.D.   On: 06/01/2018 02:04    EKG: Independently reviewed.  Paced rhythm (heart rate 84).  Assessment/Plan Principal Problem:   Acute subdural hematoma (HCC) Active Problems:   UTI (urinary tract infection)   Hypernatremia   Acute encephalopathy   Hypotension   AKI (acute kidney injury) (HCC)   Acute subdural  hematomas -Head CT showing acute right-sided subdural hematomas along the convexity of the brain, falx and right tentorium measuring up to 4.3 mm in thickness.  No midline shift reported. -Patient has a history of prior subdural hematoma in September 2019 as well. -ED provider spoke to Dr. Dutch Quint from neurosurgery, no recommendations at this time. -Frequent neurochecks -Seizure precautions -Continue home phenobarbital and Keppra  UTI -Afebrile and no leukocytosis.  Lactic acid 3.1, likely elevated due to severe dehydration. -UA with large amount of leukocytes and greater than 50 WBCs/ HPF. -Continue Levaquin -Continue IV fluid hydration -Continue to trend lactic acid  Hypernatremia Sodium 152.  Likely  secondary to severe dehydration.  Patient has not been eating or drinking at the nursing home for the past few days. -Received IV fluid boluses in the ED for hypotension -Continue gentle isotonic fluid -Serial BMPs q 4 hrs -Check urine osmolarity -Avoid rapid correction of hypernatremia  Acute encephalopathy  -Secondary to subdural hematomas, UTI, severe dehydration, and hypernatremia. -Continue management as mentioned above -Keep n.p.o., aspiration precautions, SLP eval  Hypotension -Blood pressure 88/54 on arrival, now improved after 2 L IV fluid boluses.  Likely related to severe dehydration.  Lactic acid elevated at 3.1, however, no sepsis physiology (fever, tachycardia, or tachypnea). -EKG with paced rhythm.  Troponin negative. -Continue IV fluid -Continue to trend lactic acid  AKI Creatinine 1.6, was 1.1 three years ago.  Likely prerenal due to severe dehydration. -IV fluid hydration -Renal ultrasound -Avoid nephrotoxic agents/contrast -Continue to monitor BMP -Monitor urine output  Abnormal LFTs -AST mildly elevated at 53, remainder of LFTs normal.  Abdominal exam benign.  -Continue to monitor liver function  Chronic thrombocytopenia -Stable.  Platelet count 92,000,  was 80,000 three years ago.  No signs of active bleeding. 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  Not a candidate for anticoagulation due to history of subdural hematoma.  DVT prophylaxis: SCDs Code Status: ED provider informed me that he spoke to the patient's son over the phone and discussed CODE STATUS.  Son wants the patient to be full code. Family Communication: No family available at this time. Disposition Plan: Anticipate discharge after clinical improvement. Admission status: It is my clinical opinion that admission to INPATIENT is reasonable and necessary in this 63 y.o. female . presenting with symptoms of acute encephalopathy, hypotension, concerning for subdural hematomas, UTI, hypernatremia . in the context of PMH including: Prior subdural hematoma . and pertinent positives on radiographic and laboratory data including: Head CT with evidence of subdural hematomas.  UA suggestive of infection.  Labs showing hypernatremia. . Workup and treatment include IV fluid hydration, IV antibiotic, close monitoring of hemodynamics and neurologic function.  Given the aforementioned, the predictability of an adverse outcome is felt to be significant. I expect that the patient will require at least 2 midnights in the hospital to treat this condition.    John GiovanniVasundhra Kydan Shanholtzer MD [facility] Pager [phone]- [phone]  If 7PM-7AM, please contact night-coverage [email or web address] Password [email or web address]  06/01/2018, 5:12 AM

## 2018-06-01 NOTE — Progress Notes (Signed)
Pt admitted from ED with AMS and low BP, Pt non verbal but eyes open to calls, also moans on contact, settled in bed with call light at bedside, tele monitor put and verified on pt, seizures and aspiration precaution maintained, was however reassured and will continue to monitor, v/s stable. Ana Collins, Ana Collins

## 2018-06-01 NOTE — ED Notes (Signed)
Ana Collins (son) would like an update when possible at [phone]

## 2018-06-02 ENCOUNTER — Inpatient Hospital Stay (HOSPITAL_COMMUNITY): Payer: Medicare Other

## 2018-06-02 DIAGNOSIS — R4182 Altered mental status, unspecified: Secondary | ICD-10-CM

## 2018-06-02 LAB — COMPREHENSIVE METABOLIC PANEL
ALT: 21 U/L (ref 0–44)
AST: 55 U/L — ABNORMAL HIGH (ref 15–41)
Albumin: 2.5 g/dL — ABNORMAL LOW (ref 3.5–5.0)
Alkaline Phosphatase: 101 U/L (ref 38–126)
Anion gap: 10 (ref 5–15)
BUN: 13 mg/dL (ref 8–23)
CHLORIDE: 118 mmol/L — AB (ref 98–111)
CO2: 22 mmol/L (ref 22–32)
CREATININE: 1.17 mg/dL — AB (ref 0.44–1.00)
Calcium: 8.6 mg/dL — ABNORMAL LOW (ref 8.9–10.3)
GFR calc Af Amer: 57 mL/min — ABNORMAL LOW (ref >60)
GFR, EST NON AFRICAN AMERICAN: 49 mL/min — AB (ref >60)
Glucose, Bld: 80 mg/dL (ref 70–99)
Potassium: 3.2 mmol/L — ABNORMAL LOW (ref 3.5–5.1)
Sodium: 150 mmol/L — ABNORMAL HIGH (ref 135–145)
Total Bilirubin: 1.2 mg/dL (ref 0.3–1.2)
Total Protein: 7.9 g/dL (ref 6.5–8.1)

## 2018-06-02 LAB — CBC
HCT: 37.5 % (ref 36.0–46.0)
Hemoglobin: 10.9 g/dL — ABNORMAL LOW (ref 12.0–15.0)
MCH: 26.5 pg (ref 26.0–34.0)
MCHC: 29.1 g/dL — ABNORMAL LOW (ref 30.0–36.0)
MCV: 91.2 fL (ref 80.0–100.0)
Platelets: 92 10*3/uL — ABNORMAL LOW (ref 150–400)
RBC: 4.11 MIL/uL (ref 3.87–5.11)
RDW: 17.6 % — ABNORMAL HIGH (ref 11.5–15.5)
WBC: 5.9 10*3/uL (ref 4.0–10.5)
nRBC: 0 % (ref 0.0–0.2)

## 2018-06-02 LAB — HIV ANTIBODY (ROUTINE TESTING W REFLEX): HIV Screen 4th Generation wRfx: NONREACTIVE

## 2018-06-02 LAB — TSH: TSH: 0.828 u[IU]/mL (ref 0.350–4.500)

## 2018-06-02 LAB — MAGNESIUM: MAGNESIUM: 1.8 mg/dL (ref 1.7–2.4)

## 2018-06-02 LAB — PHOSPHORUS: Phosphorus: 3.1 mg/dL (ref 2.5–4.6)

## 2018-06-02 MED ORDER — LORAZEPAM 2 MG/ML IJ SOLN
2.0000 mg | INTRAMUSCULAR | Status: DC | PRN
  Administered 2018-06-04: 2 mg via INTRAVENOUS
  Filled 2018-06-02 (×2): qty 1

## 2018-06-02 MED ORDER — KCL IN DEXTROSE-NACL 20-5-0.2 MEQ/L-%-% IV SOLN
INTRAVENOUS | Status: DC
  Administered 2018-06-02 – 2018-06-03 (×2): via INTRAVENOUS
  Filled 2018-06-02 (×5): qty 1000

## 2018-06-02 MED ORDER — LORAZEPAM 2 MG/ML IJ SOLN
INTRAMUSCULAR | Status: AC
  Filled 2018-06-02: qty 1

## 2018-06-02 MED ORDER — LORAZEPAM 2 MG/ML IJ SOLN
2.0000 mg | Freq: Once | INTRAMUSCULAR | Status: AC
  Administered 2018-06-02: 2 mg via INTRAVENOUS

## 2018-06-02 MED ORDER — POTASSIUM CHLORIDE 10 MEQ/100ML IV SOLN
10.0000 meq | INTRAVENOUS | Status: AC
  Administered 2018-06-02 (×2): 10 meq via INTRAVENOUS
  Filled 2018-06-02 (×3): qty 100

## 2018-06-02 NOTE — Progress Notes (Signed)
Patient presenting with seizure-like activity, tongue biting with intermittent L/R hand shaking. Dr. Sharon Seller notified, IV Ativan 2mg  admin per x1 order. Patient tolerated well, VSS, NAD noted, seizure activity resolved at this time.

## 2018-06-02 NOTE — Procedures (Signed)
ELECTROENCEPHALOGRAM REPORT   Patient: Ana Collins       Room #: 1G62I EEG No. ID: [id number] Age: 63 y.o.        Sex: female Referring Physician: Sharon Seller Report Date:  06/02/2018        Interpreting Physician: Thana Farr  History: Reesa Girdley is an 63 y.o. female with SDH and seizure like activity  Medications:  Keppra, Phenobarbital  Conditions of Recording:  This is a 21 channel routine scalp EEG performed with bipolar and monopolar montages arranged in accordance to the international 10/20 system of electrode placement. One channel was dedicated to EKG recording.  The patient is in the lethargic and altered state.  Patient received Ativan prior to recording.    Description:  The background activity is slow and poorly organized.  It consists of a mixture of polymorphic delta and theta activity that is diffusely distributed and continuous throughout the recording.  Some symmetrical, superimposed spindle like activity is noted as well intermittently.  The patient is stimulated during the recording.  There is increase in artifact during this time but no improvement in the background rhythm. No epileptiform activity is noted.    Hyperventilation and intermittent photic stimulation were not performed.   IMPRESSION: This EEG is characterized by slowing which is consistent with normal drowse/sleep.  Can not rule out the possibility of slowing related to general cerebral disturbance such as a metabolic encephalopathy.  Clinical correlation recommended.  No epileptiform activity is noted.       Thana Farr, MD Neurology [phone] 06/02/2018, 2:50 PM

## 2018-06-02 NOTE — Progress Notes (Signed)
Pt had bleeding episode from buccal cavity earlier and was suction, and mouth care provided. The provider on-call was paged via Amion. The provider came to the patient's room and assessed the patient and suggested that the patient bit her tongue. Through out the shift, patient has been biting her tongue and lips whenever she is awake.  Monitoring patient closely.

## 2018-06-02 NOTE — Progress Notes (Signed)
EEG completed, results pending. 

## 2018-06-02 NOTE — Progress Notes (Signed)
SLP Cancellation Note  Patient Details Name: Munachi Hollopeter MRN: [id number] DOB: 1956/04/06   Cancelled treatment:       Reason Eval/Treat Not Completed: Medical issues which prohibited therapy. SLP f/u for PO readiness; pt found in bed with gown removed, tangled in IV line. She is gnawing her tongue continuously, with bloody saliva dripping from her mouth. Pt not following commands. Suctioned oral secretions; RN aware. Will follow up next date.  Rondel Baton, [place], CCC-SLP Speech-Language Pathologist Acute Rehabilitation Services Pager: [phone] Office: [phone]    Arlana Lindau 06/02/2018, 9:34 AM

## 2018-06-02 NOTE — Plan of Care (Signed)
Patient stable, discussed POC with patient and son via telephone, agreeable with plan, PRN IV Ativan tolerated well, VSS, NAD noted.

## 2018-06-02 NOTE — Progress Notes (Signed)
[place] TEAM 1 - Stepdown/ICU TEAM  Festus BarrenLinda Ginley  ZOX:[id number] DOB: Oct 30, 1955 DOA: 06/01/2018 PCP: Karna DupesBlake, Khashana A, MD    Brief Narrative:  63yo F w/ a hx of vascular dementia, A. 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  It was reported to EMS the patient had not been voiding.  Facility reported she had not been eating or drinking for 2 days.  Significant Events: 2/1 admit   Subjective: The patient has remained altered this morning.  Her nurse began to notice rhythmic tongue biting activity along with twitching of an arm.  We dosed the patient with IV Ativan which arrested these behaviors.  At the time of my exam the patient appears to be resting comfortably.  She is sedate and does not respond to exam.  There is no family present in the room.  Assessment & Plan:  Acute subdural hematomas CT head noted acute R subdural hematomas along the convexity of the brain, falx and right tentorium measuring up to 4.3 mm in thickness w/ no midline shift - ED discussed w/ NS w/ no current need for intervention - f/u CT head given possible seizure activity today to assure no progression of SDH  Seizure disorder - Possible acute seizure Possible seizure in setting of hypernatremia and SDH not surprising - continue home phenobarbital and Keppra IV - check EEG - repeat CT head as noted above - if becomes recurrent will request Neuro consult  UTI UA with large amount of leukocytes and greater than 50 WBCs - dosed w/ empiric abx coverage - hold further abx until clear if seizure activity persists  - send urine for culture   Hypernatremia secondary to severe dehydration - cont to admin free water for careful slow correction   Acute encephalopathy  Due to subdural hematomas, UTI, severe  dehydration, and hypernatremia   Hypotension Resolved w/ volume resuscitation   AKI Baseline crt 1.1 three years ago - slowly improving w/ volume expansion   Chronic thrombocytopenia 80,000 is baseline   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 rate 14, weight 83.7 kg, SpO2 100 %.  Intake/Output Summary (Last 24 hours) at 06/02/2018 1200 Last data filed at 06/02/2018 0316 Gross per 24 hour  Intake 1087.37 ml  Output -  Net 1087.37 ml   Filed Weights   06/01/18 0600  Weight: 83.7 kg    Examination: General: No acute respiratory distress - sedate  Lungs: Clear to auscultation bilaterally without wheezes or crackles Cardiovascular: Regular rate without murmur gallop or rub normal S1 and S2 Abdomen: non-distended, soft, bs+, no mass  Extremities: No significant cyanosis, clubbing, or edema bilateral lower extremities   CBC: Recent Labs  Lab 06/01/18 0103 06/02/18 1025  WBC 6.7 5.9  NEUTROABS 3.2  --   HGB 15.8* 10.9*  HCT 52.0* 37.5  MCV 90.1 91.2  PLT 92* 92*   Basic Metabolic Panel: Recent Labs  Lab 06/01/18 0103 06/01/18 0935 06/02/18 1020  NA 152* 152* 150*  K 3.7 3.6 3.2*  CL 114* 120* 118*  CO2 24 22 22   GLUCOSE 97 84 80  BUN 20 17 13   CREATININE 1.65* 1.34* 1.17*  CALCIUM 9.6 8.8* 8.6*  MG  --   --  1.8  PHOS  --   --  3.1   GFR: CrCl cannot be calculated (Unknown ideal weight.).  Liver Function Tests: Recent Labs  Lab 06/01/18 0103 06/02/18 1020  AST 53* 55*  ALT 22 21  ALKPHOS 119 101    BILITOT 1.0 1.2  PROT 9.0* 7.9  ALBUMIN 2.8* 2.5*    CBG: Recent Labs  Lab 06/01/18 0052  GLUCAP 89    Recent Results (from the past 240 hour(s))  MRSA PCR Screening     Status: None   Collection Time: 06/01/18  5:52 AM  Result Value Ref Range Status   MRSA by PCR NEGATIVE NEGATIVE Final    Comment:        The GeneXpert MRSA Assay (FDA approved for NASAL specimens only), is one component of a comprehensive MRSA colonization surveillance program. It is not intended to diagnose MRSA infection nor to guide or monitor treatment for MRSA infections. Performed at [facility], [address] [place], [place] [address]      Scheduled Meds: . donepezil  10 mg Oral QHS  . PHENObarbital  65 mg Intravenous QHS  . polyvinyl alcohol  1 drop Both Eyes TID AC & HS  . sodium chloride flush  10-40 mL Intracatheter Q12H  . umeclidinium-vilanterol  1 puff Inhalation Daily     LOS: 1 day    Lonia BloodJeffrey T. , MD [facility] Office  [phone] Pager - Text Page per Amion as per below:  On-Call/Text Page:      [email or web address]  If 7PM-7AM, please contact night-coverage [email or web address] 06/02/2018, 12:00 PM

## 2018-06-03 LAB — CBC
HCT: 35.8 % — ABNORMAL LOW (ref 36.0–46.0)
Hemoglobin: 10.6 g/dL — ABNORMAL LOW (ref 12.0–15.0)
MCH: 26.2 pg (ref 26.0–34.0)
MCHC: 29.6 g/dL — ABNORMAL LOW (ref 30.0–36.0)
MCV: 88.6 fL (ref 80.0–100.0)
PLATELETS: 65 10*3/uL — AB (ref 150–400)
RBC: 4.04 MIL/uL (ref 3.87–5.11)
RDW: 17.5 % — ABNORMAL HIGH (ref 11.5–15.5)
WBC: 4 10*3/uL (ref 4.0–10.5)
nRBC: 0 % (ref 0.0–0.2)

## 2018-06-03 LAB — COMPREHENSIVE METABOLIC PANEL
ALK PHOS: 99 U/L (ref 38–126)
ALT: 21 U/L (ref 0–44)
AST: 58 U/L — AB (ref 15–41)
Albumin: 2.4 g/dL — ABNORMAL LOW (ref 3.5–5.0)
Anion gap: 10 (ref 5–15)
BUN: 10 mg/dL (ref 8–23)
CALCIUM: 8.5 mg/dL — AB (ref 8.9–10.3)
CO2: 22 mmol/L (ref 22–32)
Chloride: 119 mmol/L — ABNORMAL HIGH (ref 98–111)
Creatinine, Ser: 1.01 mg/dL — ABNORMAL HIGH (ref 0.44–1.00)
GFR calc Af Amer: >60 mL/min (ref >60)
GFR calc non Af Amer: 59 mL/min — ABNORMAL LOW (ref >60)
Glucose, Bld: 108 mg/dL — ABNORMAL HIGH (ref 70–99)
Potassium: 3.6 mmol/L (ref 3.5–5.1)
Sodium: 151 mmol/L — ABNORMAL HIGH (ref 135–145)
Total Bilirubin: 0.9 mg/dL (ref 0.3–1.2)
Total Protein: 7.2 g/dL (ref 6.5–8.1)

## 2018-06-03 LAB — MAGNESIUM: Magnesium: 1.8 mg/dL (ref 1.7–2.4)

## 2018-06-03 LAB — AMMONIA: Ammonia: 39 umol/L — ABNORMAL HIGH (ref 9–35)

## 2018-06-03 MED ORDER — DONEPEZIL HCL 10 MG PO TABS
10.0000 mg | ORAL_TABLET | Freq: Every day | ORAL | Status: DC
  Administered 2018-06-03 – 2018-06-07 (×5): 10 mg via ORAL
  Filled 2018-06-03 (×5): qty 1

## 2018-06-03 MED ORDER — POTASSIUM CL IN DEXTROSE 5% 20 MEQ/L IV SOLN
20.0000 meq | INTRAVENOUS | Status: DC
  Administered 2018-06-03 – 2018-06-07 (×8): 20 meq via INTRAVENOUS
  Filled 2018-06-03 (×7): qty 1000

## 2018-06-03 MED ORDER — ACETAMINOPHEN 325 MG PO TABS
650.0000 mg | ORAL_TABLET | Freq: Four times a day (QID) | ORAL | Status: DC | PRN
  Administered 2018-06-05: 650 mg via ORAL
  Filled 2018-06-03: qty 2

## 2018-06-03 NOTE — Progress Notes (Signed)
[place] TEAM 1 - Stepdown/ICU TEAM  Ana Collins  [id number]:[id number] DOB: 08-29-1955 DOA: 06/01/2018 PCP: Karna Dupes, MD    Brief Narrative:  63yo F w/ a hx of vascular dementia, A. 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  It was reported to EMS the patient had not been voiding.  Facility reported she had not been eating or drinking for 2 days.  Significant Events: 2/1 admit   Subjective: The patient is much more alert today.  She is able to converse with me.  She denies shortness of breath or chest pain.  She reports some pain in her left wrist.  She is in no respiratory distress.  She denies headache.  Assessment & Plan:  Acute subdural hematomas CT head noted acute R subdural hematomas along the convexity of the brain, falx and right tentorium measuring up to 4.3 mm in thickness w/ no midline shift - ED discussed w/ NS w/ no current need for intervention - f/u CT head 2/2 due to possible seizure activity noted stability in SDH -follow clinically  Seizure disorder - Possible acute seizure Possible seizure 2/2 in setting of hypernatremia and SDH not surprising - continue home phenobarbital and Keppra IV - EEG without evidence of seizure at the time of exam - repeat CT head as noted above - if becomes recurrent will request Neuro consult -appears to be stable at this time -continue to correct issues that could be driving recurrent seizures  UTI UA with large amount of leukocytes and greater than 50 WBCs - dosed w/ empiric abx coverage initially - remains afebrile - WBC normal -urine culture has been sent -avoid further antibiotic unless patient clearly becomes symptomatic or urine culture is compelling  Hypernatremia secondary to severe dehydration - increase free water admin and  follow    Acute encephalopathy  Due to subdural hematomas, UTI, severe dehydration, and hypernatremia   Hypotension Resolved w/ volume resuscitation   AKI Baseline crt 1.1 three years ago - has normalized w/ volume expansion    Chronic thrombocytopenia 80,000 is baseline - follow trend   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 and check anemia panel -no evidence grossly of blood loss  DVT prophylaxis: SCDs Code Status: FULL CODE Family Communication: no family present  Disposition Plan: SDU  Consultants:  none  Antimicrobials:  Levaquin 1/31   Objective: Blood pressure 130/69, pulse 65, temperature (!) 97.5 F (36.4 C), temperature source Axillary, resp. rate 16, weight 83.7 kg, SpO2 100 %.  Intake/Output Summary (Last 24 hours) at 06/03/2018 1350 Last data filed at 06/03/2018 0630 Gross per 24 hour  Intake 1242.59 ml  Output -  Net 1242.59 ml   Filed Weights   06/01/18 0600 06/02/18 1030  Weight: 83.7 kg 83.7 kg    Examination: General: No acute respiratory distress -alert and conversant though mildly confused Lungs: CTA B without wheezing or crackles Cardiovascular: RRR without murmur or rub Abdomen: NT/ND, soft, bowel sounds positive, no rebound Extremities: No C/C/E bilateral lower extremities   CBC: Recent Labs  Lab 06/01/18 0103 06/02/18 1025 06/03/18 0705  WBC 6.7 5.9 4.0  NEUTROABS 3.2  --   --   HGB 15.8* 10.9* 10.6*  HCT 52.0* 37.5 35.8*  MCV 90.1 91.2 88.6  PLT 92* 92* 65*   Basic Metabolic Panel: Recent Labs  Lab 06/01/18 0103 06/01/18 0935 06/02/18 1020 06/03/18 0705  NA 152* 152* 150* 151*  K 3.7 3.6 3.2* 3.6  CL 114* 120* 118* 119*  CO2 24 22  22 22   GLUCOSE 97 84 80 108*  BUN 20 17 13 10   CREATININE 1.65* 1.34* 1.17* 1.01*  CALCIUM 9.6 8.8* 8.6* 8.5*  MG  --   --  1.8 1.8  PHOS  --   --  3.1  --    GFR: CrCl cannot be calculated (Unknown ideal weight.).  Liver Function Tests: Recent Labs  Lab 06/01/18 0103 06/02/18 1020 06/03/18 0705  AST 53* 55* 58*  ALT 22 21 21   ALKPHOS 119 101 99  BILITOT 1.0 1.2 0.9  PROT 9.0* 7.9 7.2  ALBUMIN 2.8* 2.5* 2.4*    CBG: Recent Labs  Lab 06/01/18 0052  GLUCAP 89    Recent Results (from the past 240 hour(s))  MRSA PCR Screening     Status: None   Collection Time: 06/01/18  5:52 AM  Result Value Ref Range Status   MRSA by PCR NEGATIVE NEGATIVE Final    Comment:        The GeneXpert MRSA Assay (FDA approved for NASAL specimens only), is one component of a comprehensive MRSA colonization surveillance program. It is not intended to diagnose MRSA infection nor to guide or monitor treatment for MRSA infections. Performed at [facility], [address] [place], [place] [address]      Scheduled Meds: . PHENObarbital  65 mg Intravenous QHS  . polyvinyl alcohol  1 drop Both Eyes TID AC & HS  . sodium chloride flush  10-40 mL Intracatheter Q12H     LOS: 2 days    Lonia Blood, MD [facility] Office  [phone] Pager - Text Page per Amion as per below:  On-Call/Text Page:      [email or web address]  If 7PM-7AM, please contact night-coverage [email or web address] 06/03/2018, 1:50 PM

## 2018-06-03 NOTE — Progress Notes (Signed)
SLP Cancellation Note  Patient Details Name: Ana BarrenLinda Terrones MRN: [id number] DOB: 1956-03-21   Cancelled treatment:       Reason Eval/Treat Not Completed: Other (comment);Fatigue/lethargy limiting ability to participate(pt remains lethargic, per RN only opens eyes briefly and has seizures yesterday, will continue efforts)   Chales AbrahamsKimball, Greg Cratty Ann 06/03/2018, 11:05 AM  Donavan Burnetamara Cheyeanne Roadcap, MS [facility] SLP Acute Rehab Services Pager [phone] Office [phone]

## 2018-06-04 DIAGNOSIS — R569 Unspecified convulsions: Secondary | ICD-10-CM

## 2018-06-04 DIAGNOSIS — G9340 Encephalopathy, unspecified: Secondary | ICD-10-CM

## 2018-06-04 LAB — BASIC METABOLIC PANEL
Anion gap: 9 (ref 5–15)
BUN: <5 mg/dL — ABNORMAL LOW (ref 8–23)
CO2: 21 mmol/L — ABNORMAL LOW (ref 22–32)
Calcium: 8.8 mg/dL — ABNORMAL LOW (ref 8.9–10.3)
Chloride: 117 mmol/L — ABNORMAL HIGH (ref 98–111)
Creatinine, Ser: 0.74 mg/dL (ref 0.44–1.00)
GFR calc Af Amer: >60 mL/min (ref >60)
GFR calc non Af Amer: >60 mL/min (ref >60)
Glucose, Bld: 109 mg/dL — ABNORMAL HIGH (ref 70–99)
Potassium: 3.4 mmol/L — ABNORMAL LOW (ref 3.5–5.1)
Sodium: 147 mmol/L — ABNORMAL HIGH (ref 135–145)

## 2018-06-04 LAB — CBC
HCT: 35.6 % — ABNORMAL LOW (ref 36.0–46.0)
HEMOGLOBIN: 10.7 g/dL — AB (ref 12.0–15.0)
MCH: 26 pg (ref 26.0–34.0)
MCHC: 30.1 g/dL (ref 30.0–36.0)
MCV: 86.4 fL (ref 80.0–100.0)
Platelets: 70 10*3/uL — ABNORMAL LOW (ref 150–400)
RBC: 4.12 MIL/uL (ref 3.87–5.11)
RDW: 17.2 % — ABNORMAL HIGH (ref 11.5–15.5)
WBC: 4 10*3/uL (ref 4.0–10.5)
nRBC: 0 % (ref 0.0–0.2)

## 2018-06-04 LAB — FERRITIN: Ferritin: 446 ng/mL — ABNORMAL HIGH (ref 11–307)

## 2018-06-04 LAB — IRON AND TIBC
Iron: 66 ug/dL (ref 28–170)
Saturation Ratios: 58 % — ABNORMAL HIGH (ref 10.4–31.8)
TIBC: 113 ug/dL — AB (ref 250–450)
UIBC: 47 ug/dL

## 2018-06-04 LAB — RETICULOCYTES
IMMATURE RETIC FRACT: 12.8 % (ref 2.3–15.9)
RBC.: 4.12 MIL/uL (ref 3.87–5.11)
Retic Count, Absolute: 54 10*3/uL (ref 19.0–186.0)
Retic Ct Pct: 1.3 % (ref 0.4–3.1)

## 2018-06-04 LAB — URINE CULTURE: Culture: >=100000 — AB

## 2018-06-04 LAB — FOLATE: Folate: 24.8 ng/mL (ref >5.9)

## 2018-06-04 LAB — VITAMIN B12: Vitamin B-12: 1947 pg/mL — ABNORMAL HIGH (ref 180–914)

## 2018-06-04 MED ORDER — SODIUM CHLORIDE 0.9 % IV SOLN: 200.0000 mg | Freq: Once | INTRAVENOUS | Status: DC

## 2018-06-04 MED ORDER — SULFAMETHOXAZOLE-TRIMETHOPRIM 400-80 MG/5ML IV SOLN
320.0000 mg | Freq: Two times a day (BID) | INTRAVENOUS | Status: DC
  Administered 2018-06-05 (×2): 320 mg via INTRAVENOUS
  Filled 2018-06-04 (×3): qty 20

## 2018-06-04 MED ORDER — LACOSAMIDE 10 MG/ML PO SOLN: 200.0000 mg | Freq: Two times a day (BID) | ORAL | Status: DC

## 2018-06-04 MED ORDER — SODIUM CHLORIDE 0.9 % IV SOLN
100.0000 mg | Freq: Two times a day (BID) | INTRAVENOUS | Status: DC
  Administered 2018-06-04 – 2018-06-05 (×3): 100 mg via INTRAVENOUS
  Filled 2018-06-04 (×4): qty 10

## 2018-06-04 MED ORDER — SODIUM CHLORIDE 0.9 % IV SOLN
200.0000 mg | Freq: Two times a day (BID) | INTRAVENOUS | Status: DC
  Administered 2018-06-04: 200 mg via INTRAVENOUS
  Filled 2018-06-04 (×2): qty 20

## 2018-06-04 MED ORDER — LEVOFLOXACIN IN D5W 750 MG/150ML IV SOLN
750.0000 mg | INTRAVENOUS | Status: AC
  Administered 2018-06-04: 750 mg via INTRAVENOUS
  Filled 2018-06-04: qty 150

## 2018-06-04 NOTE — Plan of Care (Signed)
Pt is not wanting liquids. RN has attempted several of times, but pt does not swallow or begins coughing. RN will continue to monitor and continue to try.

## 2018-06-04 NOTE — Progress Notes (Signed)
Ana Collins  Ana BarrenLinda Collins  [id number]:[id number] DOB: February 26, 1956 DOA: 06/01/2018 PCP: Ana Collins, Ana A, MD    Brief Narrative:  63yo F w/ Collins hx of vascular dementia, Collins. 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  It was reported to EMS the patient had not been voiding.  Facility reported she had not been eating or drinking for 2 days.  Significant Events: 2/1 admit  2/2 seizure - f/u CT w/o change in SDH 2/4 seizure > Neuro consulted   Subjective: While working with SLP today the patient was noted to abruptly developed rhythmic twitching of her right hand which then spread up to involve her face.  The patient's nurse was alerted and she was quickly dosed with Ativan which arrested this behavior.  At the time of my visit the patient is resting comfortably in bed and is mildly sedated post Ativan dosing.  There is no respiratory distress or evidence of uncontrolled pain.  Assessment & Plan:  Acute subdural hematomas CT head noted acute R subdural hematomas along the convexity of the brain, falx and right tentorium measuring up to 4.3 mm in thickness w/ no midline shift - ED discussed w/ NS w/ no current need for intervention - f/u CT head 2/2 due to possible seizure activity noted stability in SDH  Seizure disorder -recurring acute seizures Possible seizure 2/2 in setting of hypernatremia and SDH not surprising - continue home phenobarbital and Keppra IV - EEG without evidence of seizure at the time of exam - repeat CT head as noted above -with recurrence of seizure today I will ask Neurology to evaluate to make recommendations for potential medication changes or further investigations  Klebsiella UTI UA with large amount of leukocytes and greater than 50 WBCs - dosed w/  empiric abx coverage initially -has remained afebrile w/ WBC normal -urine culture now revealing Collins large colony count of Klebsiella - will initiate antibiotic coverage  Hypernatremia secondary to severe dehydration -slowly improving with free water administration  Acute encephalopathy  Due to subdural hematomas, UTI, severe dehydration, hypernatremia, as well as requirement for intermittent Ativan dosing  Hypotension Resolved w/ volume resuscitation   AKI Baseline crt 1.1 three years ago - has normalized w/ volume expansion    Chronic thrombocytopenia 80,000 is baseline - relatively stable at this time  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 Holding home aspirin   Atrial fibrillation not Collins candidate for anticoagulation due to history of subdural hematomas - rate controlled   Normocytic anemia Hemoglobin appears to be stabilizing after decrease felt to be due to dilution - no evidence grossly of blood loss -anemia panel consistent with anemia of chronic disease without iron deficiency  DVT prophylaxis: SCDs Code Status: FULL CODE Family Communication: no family present  Disposition Plan: SDU  Consultants:  none  Antimicrobials:  Levaquin 1/31   Objective: Blood pressure 135/62, pulse 62, temperature 97.8 F (36.6 C), temperature source Oral, resp. rate 20, weight 83.7 kg, SpO2 100 %.  Intake/Output Summary (Last 24 hours) at 06/04/2018 1012 Last data filed at 06/04/2018 0300 Gross per 24 hour  Intake 860 ml  Output -  Net 860 ml   Filed Weights   06/01/18 0600 06/02/18 1030  Weight: 83.7 kg 83.7 kg    Examination: General: No acute respiratory distress -  more sedate today having just received IV Ativan Lungs: CTA B - no wheeze or crackles  Cardiovascular: reg rate - no M or rub  Abdomen: NT/ND, soft, bowel sounds positive, no rebound Extremities: trace B LE edema    CBC: Recent Labs  Lab 06/01/18 0103 06/02/18 1025  06/03/18 0705 06/04/18 0746  WBC 6.7 5.9 4.0 4.0  NEUTROABS 3.2  --   --   --   HGB 15.8* 10.9* 10.6* 10.7*  HCT 52.0* 37.5 35.8* 35.6*  MCV 90.1 91.2 88.6 86.4  PLT 92* 92* 65* 70*   Basic Metabolic Panel: Recent Labs  Lab 06/01/18 0103 06/01/18 0935 06/02/18 1020 06/03/18 0705 06/04/18 0746  NA 152* 152* 150* 151* 147*  K 3.7 3.6 3.2* 3.6 3.4*  CL 114* 120* 118* 119* 117*  CO2 24 22 22 22  21*  GLUCOSE 97 84 80 108* 109*  BUN 20 17 13 10  <5*  CREATININE 1.65* 1.34* 1.17* 1.01* 0.74  CALCIUM 9.6 8.8* 8.6* 8.5* 8.8*  MG  --   --  1.8 1.8  --   PHOS  --   --  3.1  --   --    GFR: CrCl cannot be calculated (Unknown ideal weight.).  Liver Function Tests: Recent Labs  Lab 06/01/18 0103 06/02/18 1020 06/03/18 0705  AST 53* 55* 58*  ALT 22 21 21   ALKPHOS 119 101 99  BILITOT 1.0 1.2 0.9  PROT 9.0* 7.9 7.2  ALBUMIN 2.8* 2.5* 2.4*    CBG: Recent Labs  Lab 06/01/18 0052  GLUCAP 89    Recent Results (from the past 240 hour(s))  Culture, Urine     Status: Abnormal   Collection Time: 06/01/18  2:03 AM  Result Value Ref Range Status   Specimen Description URINE, RANDOM  Final   Special Requests   Final    NONE Performed at [facility], [address] [place], [place] [address]    Culture >=100,000 COLONIES/mL KLEBSIELLA PNEUMONIAE (Collins)  Final   Report Status 06/04/2018 FINAL  Final   Organism ID, Bacteria KLEBSIELLA PNEUMONIAE (Collins)  Final      Susceptibility   Klebsiella pneumoniae - MIC*    AMPICILLIN RESISTANT Resistant     CEFAZOLIN <=4 SENSITIVE Sensitive     CEFTRIAXONE <=1 SENSITIVE Sensitive     CIPROFLOXACIN <=0.25 SENSITIVE Sensitive     GENTAMICIN <=1 SENSITIVE Sensitive     IMIPENEM <=0.25 SENSITIVE Sensitive     NITROFURANTOIN 64 INTERMEDIATE Intermediate     TRIMETH/SULFA <=20 SENSITIVE Sensitive     AMPICILLIN/SULBACTAM 4 SENSITIVE Sensitive     PIP/TAZO <=4 SENSITIVE Sensitive     Extended ESBL NEGATIVE Sensitive     * >=100,000  COLONIES/mL KLEBSIELLA PNEUMONIAE  MRSA PCR Screening     Status: None   Collection Time: 06/01/18  5:52 AM  Result Value Ref Range Status   MRSA by PCR NEGATIVE NEGATIVE Final    Comment:        The GeneXpert MRSA Assay (FDA approved for NASAL specimens only), is one component of Collins comprehensive MRSA colonization surveillance program. It is not intended to diagnose MRSA infection nor to guide or monitor treatment for MRSA infections. Performed at [facility], [address] [place], [place] [address]      Scheduled Meds: . donepezil  10 mg Oral QHS  . PHENObarbital  65 mg Intravenous QHS  . polyvinyl alcohol  1 drop Both Eyes TID AC & HS  .  sodium chloride flush  10-40 mL Intracatheter Q12H     LOS: 3 days    Lonia Blood, MD [facility] Office  [phone] Pager - Text Page per Amion as per below:  On-Call/Text Page:      [email or web address]  If 7PM-7AM, please contact night-coverage [email or web address] 06/04/2018, 10:12 AM

## 2018-06-04 NOTE — Progress Notes (Signed)
BSE completed, full report to follow.  Recommend advance diet to full liquids.  Pt noted to demonstrate right arm tremor that progressed to oral facial region - RN informed and arrived within 1 minute to assess pt.  Pt then provided medication and able to fall asleep.  Will follow up for po tolerance, readiness for dietary advancement.    Donavan Burnet, MS [facility] SLP Acute Rehab Services Pager [phone] Office [phone]

## 2018-06-04 NOTE — Progress Notes (Signed)
RN called in by speech therapy due to pt having a seizure. Upon arrival RN noticed pt's R arm twitching along with R side of her mouth also twitching. RN administered PRN ativan; and seizure stopped after about 1 minute. Vitals BP 135/62, pulse 64, 100 (room air)

## 2018-06-04 NOTE — Progress Notes (Signed)
Since she is not taking PO well, we will use IV septra for her UTI. She got a dose of Levaquin already so will start Septra tomorrow to complete 3d. D/w Dr Sharon Seller.  Septra 320mg  IV q12 x 2 more days  Ulyses Southward, PharmD, [place], AAHIVP, CPP Infectious Disease Pharmacist 06/04/2018 1:05 PM

## 2018-06-04 NOTE — Care Management Important Message (Signed)
Important Message  Patient Details  Name: Ana Collins MRN: [id number] Date of Birth: 1955-12-08   Medicare Important Message Given:  Yes    Dorena Bodo 06/04/2018, 3:27 PM

## 2018-06-04 NOTE — Consult Note (Addendum)
Neurology Consultation  Reason for Consult: Seizure in setting of UTI, ileus, CVA and subdural hematoma  Referring Physician: Dr. Reather Littler  CC: Seizure  History is obtained from: Chart as patient is extremely drowsy and encephalopathic  HPI: Ana Collins is a 63 y.o. 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  Patient presented to the hospital via EMS from [place] [facility] facility with hypotension and altered mental status.  Patient was found to have a Klebsiella UTI.  While patient was in the hospital she was noted to abruptly develop rhythmic twitching of her right hand which then spread up to involve her face.  These features resolved after receiving Ativan.  Currently the patient is extremely lethargic and only responds to noxious stimuli.  No history can be obtained from the patient.  She does localize to pain.  ROS: Unable to obtain due to altered mental status.    Past Medical History:  Diagnosis Date  . Anemia   . Atrial fibrillation (HCC)   . CHF (congestive heart failure) (HCC)    diastolic  . CVD (cardiovascular disease)   . Dementia (HCC)    vascular  . Epilepsy (HCC)   . GERD (gastroesophageal reflux disease)   . Heart disease    chronic ischemic  . Hyperlipidemia   . Hypertension   . Pacemaker   . Traumatic subdural hemorrhage (HCC)    w/o LOC    Family History  Problem Relation Age of Onset  . CVA Mother     Social History:   reports that she has never smoked. She has never used smokeless tobacco. She reports that she does not drink alcohol or use drugs.  Medications  Current Facility-Administered Medications:  .  [DISCONTINUED] acetaminophen (TYLENOL) tablet 650 mg, 650 mg, Oral, Q6H PRN **OR** acetaminophen (TYLENOL) suppository 650 mg, 650 mg, Rectal, Q6H PRN,  John Giovanni, MD .  acetaminophen (TYLENOL) tablet 650 mg, 650 mg, Oral, Q6H PRN, Jetty Duhamel T, MD .  dextrose 5 % with KCl 20 mEq / L  infusion, 20 mEq, Intravenous, Continuous, Lonia Blood, MD, Last Rate: 100 mL/hr at 06/04/18 0137, 20 mEq at 06/04/18 0137 .  donepezil (ARICEPT) tablet 10 mg, 10 mg, Oral, QHS, Lonia Blood, MD, 10 mg at 06/03/18 2113 .  levETIRAcetam (KEPPRA) IVPB 1500 mg/ 100 mL premix, 1,500 mg, Intravenous, Q12H, John Giovanni, MD, Last Rate: 400 mL/hr at 06/04/18 1016, 1,500 mg at 06/04/18 1016 .  levofloxacin (LEVAQUIN) IVPB 750 mg, 750 mg, Intravenous, Q24H, McClung, Elpidio , MD .  LORazepam (ATIVAN) injection 2 mg, 2 mg, Intravenous, Q4H PRN, Lonia Blood, MD, 2 mg at 06/04/18 [phone] .  ondansetron (ZOFRAN) injection 4 mg, 4 mg, Intravenous, Q6H PRN, John Giovanni, MD .  PHENObarbital (LUMINAL) injection 65 mg, 65 mg, Intravenous, QHS, John Giovanni, MD, 65 mg at 06/03/18 2110 .  polyvinyl alcohol (LIQUIFILM TEARS) 1.4 % ophthalmic solution 1 drop, 1 drop, Both Eyes, TID AC & HS, John Giovanni, MD, 1 drop at 06/04/18 0551 .  sodium chloride flush (NS) 0.9 % injection 10-40 mL, 10-40 mL, Intracatheter, Q12H, Lonia Blood, MD, 20 mL at 06/02/18 2152 .  sodium chloride flush (NS) 0.9 % injection 10-40 mL, 10-40 mL, Intracatheter, PRN, Lonia Blood, MD   Exam: Current vital signs: BP 135/62 (BP Location: Left Leg)   Pulse 62   Temp  97.8 F (36.6 C) (Oral)   Resp 20   Wt 83.7 kg   SpO2 100%  Vital signs in last 24 hours: Temp:  [97.5 F (36.4 C)-98.9 F (37.2 C)] 97.8 F (36.6 C) (02/04 0729) Pulse Rate:  [62-70] 62 (02/04 0729) Resp:  [16-22] 20 (02/04 0729) BP: (119-154)/(62-91) 135/62 (02/04 0947) SpO2:  [100 %] 100 % (02/04 0947)  Physical Exam  Constitutional: Appears well-developed and well-nourished.  Psych: Affect appropriate to situation Eyes: No scleral injection, exophthalmos HENT: No  OP obstrucion Head: Normocephalic.  Cardiovascular: Atrial fibrillation Respiratory: Effort normal, non-labored breathing GI: Soft.  No distension.   Skin: WDI  Neuro: Mental Status: Patient is very lethargic. Initially would not open eyes or respond to verbal stimuli.  With noxious stimuli to arms and light sternal rub she did open her eyes and groan.  With deeper sternal rub she did localize with her right hand.  Patient was not able to follow any commands.  She did blink to threat.  When trying to open her mouth she clenched her mouth shut.  The only verbal output obtained was moaning, for PA and severely dysarthric, slowed speech for Neurology attending - all speech output was unintelligible.  Cranial Nerves: II: Blinks to threat bilaterally. III,IV, VI: Doll's eyes intact with disconjugate gaze.  Pupils are equal, round, and reactive to light.   V: Facial sensation intact to noxious stimuli VII: Left facial droop. VIII: Attempts to answer questions XII: Unable to evaluate tongue secondary to patient clenching teeth shut.  Motor: Moving right upper extremity antigravity while localizing to pain.  Patient is moving her left upper extremity minimally with increased tone.  Patient withdraws from pain briskly in bilateral legs. Sensory: Sensation intact throughout to noxious stimuli Deep Tendon Reflexes: 3+ at biceps, brachioradialis and knee jerks with no ankle jerks; no clonus Plantars: Downgoing bilaterally Cerebellar: Unable to obtain  Labs I have reviewed labs in epic and the results pertinent to this consultation are:   CBC    Component Value Date/Time   WBC 4.0 06/04/2018 0746   RBC 4.12 06/04/2018 0746   RBC 4.12 06/04/2018 0746   HGB 10.7 (L) 06/04/2018 0746   HGB 13.0 08/09/2014 0224   HCT 35.6 (L) 06/04/2018 0746   HCT 40.8 08/09/2014 0224   PLT 70 (L) 06/04/2018 0746   PLT 80 (L) 08/09/2014 0224   MCV 86.4 06/04/2018 0746   MCV 88 08/09/2014 0224   MCH 26.0  06/04/2018 0746   MCHC 30.1 06/04/2018 0746   RDW 17.2 (H) 06/04/2018 0746   RDW 15.2 (H) 08/09/2014 0224   LYMPHSABS 2.0 06/01/2018 0103   MONOABS 1.2 (H) 06/01/2018 0103   EOSABS 0.3 06/01/2018 0103   BASOSABS 0.1 06/01/2018 0103    CMP     Component Value Date/Time   NA 147 (H) 06/04/2018 0746   NA 139 08/09/2014 0224   K 3.4 (L) 06/04/2018 0746   K 4.1 08/09/2014 0224   CL 117 (H) 06/04/2018 0746   CL 104 08/09/2014 0224   CO2 21 (L) 06/04/2018 0746   CO2 29 08/09/2014 0224   GLUCOSE 109 (H) 06/04/2018 0746   GLUCOSE 100 (H) 08/09/2014 0224   BUN <5 (L) 06/04/2018 0746   BUN 19 08/09/2014 0224   CREATININE 0.74 06/04/2018 0746   CREATININE 1.19 (H) 08/09/2014 0224   CALCIUM 8.8 (L) 06/04/2018 0746   CALCIUM 9.2 08/09/2014 0224   PROT 7.2 06/03/2018 0705   ALBUMIN 2.4 (L) 06/03/2018  0705   AST 58 (H) 06/03/2018 0705   ALT 21 06/03/2018 0705   ALKPHOS 99 06/03/2018 0705   BILITOT 0.9 06/03/2018 0705   GFRNONAA >60 06/04/2018 0746   GFRNONAA 50 (L) 08/09/2014 0224   GFRAA >60 06/04/2018 0746   GFRAA 57 (L) 08/09/2014 0224   EEG: This EEG is characterized by slowing which is consistent with normal drowse/sleep.  Can not rule out the possibility of slowing related to general cerebral disturbance such as a metabolic encephalopathy.  Clinical correlation recommended.  No epileptiform activity is noted.    Lipid Panel  No results found for: CHOL, TRIG, HDL, CHOLHDL, VLDL, LDLCALC, LDLDIRECT   Imaging I have reviewed the images obtained:  CT-scan of the brain-obtained 06/02/2018.-Isodense right sided subdural hematoma again 4 to 5 mm in thickness with small 5 mm parafalcine subdural.  No intracranial mass-effect.  No associated skull fracture.  Multifocal encephalomalacia in the right hemisphere with ex vacuo and ventricular enlargement.  Felicie MornDavid Smith PA-C [facility] [phone] 06/04/2018, 11:05 AM     Assessment:  63 year old female with history of CVA,  right subdural hematoma and epilepsy.  Patient at SNF is on phenobarbital and Keppra.  In the setting of a UTI the patient has had a breakthrough seizure while in the hospital.   1. UTI most likely has decreased the patient's seizure threshold. 2. Neurological examination findings most consistent with a delirium +/- postictal state. Underlying etiology most likely a combination of her dementia, postictal state and encephalopathy secondary to intercurrent infection.  3. EEG with slowing, consistent with encephalopathy. No electrographic seizures noted 4. Also noted is LUE weakness consistent with her right subdural hematoma 5. No meningismus  Recommendations: - Start Vimpat 100 mg twice daily IV after 200 mg IV load - Continue Keppra and phenobarbital -- Continue seizure precautions -- Frequent neuro checks -- Neurology will continue to follow   Electronically signed: Dr. Caryl PinaEric Olegario Emberson

## 2018-06-04 NOTE — Progress Notes (Signed)
RN attempted to give pt a small amount of water. Pt did not tolerate well and immediately began choking. RN provided oral care afterwards. Will notify speech.

## 2018-06-04 NOTE — Progress Notes (Signed)
  Speech Language Pathology Treatment: Dysphagia  Patient Details Name: Ana Collins MRN: [id number] DOB: 09-01-1955 Today's Date: 06/04/2018 Time: [id number] SLP Time Calculation (min) (ACUTE ONLY): 30 min  Assessment / Plan / Recommendation Clinical Impression  Pt today more alert and was consuming po given by NT upon SLP arrival to room.  SLP provided pt with advanced textures/modifiers to assess readiness for dietary advancement/po tolerance.  Intake of cracker, pudding, jello and juice provided.  Moderately delayed mastication with cracker suspected due to length of time pt required to swallow.  (over 2 minutes with small bolus) No oral residuals apparent. No indication of aspiration or laryngeal penetration with po observed = pt chronically vocalized as if sighing and per NT this is baseline from yesterday.    Of note during session, pt noted to demonstrate onset of right arm tremor that progressed to right facial musculature, SLP contacted RN to request her arrival. She arrived within one minute and provided pt with Ativan which ceased activity.    Recommend advance diet to full liquids due to likely oral deficits using caution to assure pt alert and without signs of seizure.  Will follow up next date.  Thanks.    HPI HPI: 63yo F w/ a hx of vascular dementia, A. 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  It was reported to EMS the patient had not been voiding.  Facility reported she had not been eating or drinking for 2 days. CT of the head noted acute right subdural hematomas. Also with UTI.       SLP Plan  Continue with current plan of care       Recommendations  Diet recommendations: Thin liquid(full liquids) Liquids provided via: Straw;Cup Medication Administration: Whole meds  with puree(with puree) Supervision: Full supervision/cueing for compensatory strategies Compensations: Minimize environmental distractions;Slow rate;Small sips/bites(check for oral residuals on left) Postural Changes and/or Swallow Maneuvers: Seated upright 90 degrees;Upright 30-60 min after meal                Oral Care Recommendations: Oral care QID Follow up Recommendations: None SLP Visit Diagnosis: Dysphagia, oropharyngeal phase (R13.12) Plan: Continue with current plan of care       GO                Ana Collins 06/04/2018, 11:00 AM Ana Burnet, MS [facility] SLP Acute Rehab Services Pager [phone] Office [phone]

## 2018-06-05 LAB — BASIC METABOLIC PANEL
Anion gap: 4 — ABNORMAL LOW (ref 5–15)
BUN: <5 mg/dL — ABNORMAL LOW (ref 8–23)
CO2: 24 mmol/L (ref 22–32)
Calcium: 8.5 mg/dL — ABNORMAL LOW (ref 8.9–10.3)
Chloride: 118 mmol/L — ABNORMAL HIGH (ref 98–111)
Creatinine, Ser: 0.85 mg/dL (ref 0.44–1.00)
GFR calc Af Amer: >60 mL/min (ref >60)
GFR calc non Af Amer: >60 mL/min (ref >60)
Glucose, Bld: 139 mg/dL — ABNORMAL HIGH (ref 70–99)
Potassium: 3.7 mmol/L (ref 3.5–5.1)
Sodium: 146 mmol/L — ABNORMAL HIGH (ref 135–145)

## 2018-06-05 MED ORDER — THIAMINE HCL 100 MG/ML IJ SOLN
500.0000 mg | Freq: Three times a day (TID) | INTRAVENOUS | Status: DC
  Administered 2018-06-05 – 2018-06-07 (×8): 500 mg via INTRAVENOUS
  Filled 2018-06-05 (×11): qty 5

## 2018-06-05 MED ORDER — THIAMINE HCL 100 MG/ML IJ SOLN: 100.0000 mg | Freq: Every day | INTRAMUSCULAR | Status: DC

## 2018-06-05 NOTE — Progress Notes (Signed)
  Speech Language Pathology Treatment:    Patient Details Name: Ana BarrenLinda Collins MRN: [id number] DOB: 07-Feb-1956 Today's Date: 06/05/2018 Time: [id number] SLP Time Calculation (min) (ACUTE ONLY): 30 min  Assessment / Plan / Recommendation Clinical Impression  Pt today alert and continues with delayed swallow but no indications of airway compromise.  Delayed responses to SLP noted which may be due to her CVA and dementia hx.  Consistency assessment including puree, nectar and thin via administration through straw, cup and tsp.  Delays in swallow up to 17 seconds with yogurt and pt required oral suction x1 of 4 boluses after she did not elicit swallow.  Total cues required to encourage oral transit/swallow.  Belch x1 noted which pt admitted was reflux and then advised it cleared independently after instructed to swallow to clear. Pt is a laborious feeder and at this time, nectar liquids are most efficient for her to consume.  Advised pt to plan, she was agreeable.  Skilled intervention including use of straw with hand over hand assist improved swallow efficiency.     Recommend continue full liquids - nectar only due to positioning limitations and suspected decreased oral control.  SLP will follow up - recommend dietician consult *if not done yet* to help maximize nutrition.    Given pt chronic medical issues, recommend consider palliative consult to help establish goals of care.  Thanks.   HPI HPI: 63yo F w/ a hx of vascular dementia, A. 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  It was reported to EMS the patient had not been voiding.  Facility reported she had not been eating or drinking for 2 days. CT of the head noted acute right subdural hematomas. Also with UTI.       SLP Plan  Continue with current  plan of care       Recommendations  Diet recommendations: Nectar-thick liquid(full liquids) Liquids provided via: Straw;Cup Medication Administration: Whole meds with puree(with puree) Supervision: Full supervision/cueing for compensatory strategies Compensations: Minimize environmental distractions;Slow rate;Small sips/bites(check for oral residuals on left) Postural Changes and/or Swallow Maneuvers: Seated upright 90 degrees;Upright 30-60 min after meal                Oral Care Recommendations: Oral care QID Follow up Recommendations: Skilled Nursing facility SLP Visit Diagnosis: Dysphagia, oropharyngeal phase (R13.12) Plan: Continue with current plan of care       GO                Chales AbrahamsKimball, Jahmai Finelli Ann 06/05/2018, 11:14 AM Donavan Burnetamara Hilery Wintle, MS [facility] S[facility] Acute Rehab Services Pager [phone] Office [phone]

## 2018-06-05 NOTE — Progress Notes (Signed)
PROGRESS NOTE    Ana Collins  DHR:[id number] DOB: 1955-05-11 DOA: 06/01/2018 PCP: Karna Dupes, MD   Brief Narrative: 63 year old relevant for vascular dementia, atrial fibrillation status post pacemaker, chronic diastolic heart failure, hypertension, hyperlipidemia, seizure disorder, history of CVA with residual left-sided weakness and specific spasticity admitted from [facility] haven for evaluation of hypotension and altered mental status and found to have hyponatremia.  Assessment & Plan:   Principal Problem:   Acute subdural hematoma (HCC) Active Problems:   UTI (urinary tract infection)   Hypernatremia   Acute encephalopathy   Hypotension   AKI (acute kidney injury) (HCC)   #) Altered mental status: Patient's baseline pertinently she is normally verbal.  He did this appears to be gradually improving and suggests a metabolic cause versus prolonged postictal period.  Certainly it is not clear if her hyponatremia is playing into this.  Subdural hematoma is stable and not likely to be playing a role -Neurology following, appreciate recommendations -EEG shows only diffuse slowing -We will consider MRI/MRA of brain  #) Subdural hematomas: CT head showed subdural hematomas that were stable on repeat imaging.  Neurosurgery did not feel that these warranted intervention.  #) Dysphagia: Unclear if acute or related to current mental status.  Currently patient is taking very little p.o. due to her encephalopathy despite the fact that she has been recommended to advance to full liquid diet. -We will discuss with son about goals of care  #) Seizure disorder: Currently patient is not actively seizing but has had episodes of seizure-like activity -Neurology following, appreciate recommendations -Continue IV levetiracetam thousand milligrams twice daily - Continue phenobarbital 65 mg nightly -Continue lacosamide 100 mg every 12 hours  #) History of CVA: Patient has on imaging at least  evidence of CVAs of both the temporal lobe as well as brainstem raising. -We will discuss with neurology about MRI brain - Holding aspirin 325 mg daily due to subdural hematoma, can likely restart in 2 weeks  #) Possible UTI: Urine culture grew out relatively sensitive Klebsiella -Continue IV Bactrim  #) Atrial fibrillation status post pacer: -Per above not a candidate for anticoagulation due to subdural hematoma  #) Hypertension/hyperlipidemia: - Hold aspirin 25 mg daily - Hold pravastatin 80 mg daily  #) COPD: -Continue L AMA/LABA  #) Chronic diastolic heart failure: -Hold furosemide 60 mg daily  #) Pain/psych: -Hold sertraline 50 mg daily  #) Dementia: -Hold donepezil 10 mg nightly  Fluids: Holding Electrolytes: Monitor and supplement Nutrition: Per above  Prophylaxis: SCDs  Disposition: Pending resolution of altered mental status  Full code   Consultants:   Neurology  Procedures:  06/02/2018 IWO:EHOZ EEG is characterized by slowing which is consistent with normal drowse/sleep.  Can not rule out the possibility of slowing related to general cerebral disturbance such as a metabolic encephalopathy.  Clinical correlation recommended.  No epileptiform activity is noted.    Antimicrobials:   IV Bactrim started 06/04/2018   Subjective: This morning the patient does not have any complaints.  She appears to be more awake and responsive but is quite slowed and not oriented.  She denies any nausea, vomiting, diarrhea, abdominal pain, chest pain.  Objective: Vitals:   06/04/18 2000 06/04/18 2115 06/05/18 0019 06/05/18 0336  BP:  (!) 139/56 137/65 (!) 152/87  Pulse:    71  Resp: 18 18 17  (!) 21  Temp: 98.3 F (36.8 C)  98.4 F (36.9 C) 98.2 F (36.8 C)  TempSrc: Axillary  Axillary Axillary  SpO2:  99% 98% 99% 98%  Weight:        Intake/Output Summary (Last 24 hours) at 06/05/2018 1024 Last data filed at 06/05/2018 0600 Gross per 24 hour  Intake 1725 ml  Output -    Net 1725 ml   Filed Weights   06/01/18 0600 06/02/18 1030  Weight: 83.7 kg 83.7 kg    Examination:  General exam: Does not appear to be in distress Respiratory system: Clear to auscultation. Respiratory effort normal. Cardiovascular system: Regular rate and rhythm, no murmurs Gastrointestinal system: Abdomen is nondistended, soft and nontender. No organomegaly or masses felt. Normal bowel sounds heard. Central nervous system: Alert but not oriented to situation, bradycardia phrenic, does not move left side, mildly hyperreflexic on left side, strength on right side is 4 out of 5, symmetric face Extremities: No lower extremity edema r. Skin: No rashes over visible skin Psychiatry: J unable to assess due to underlying medical condition    Data Reviewed: I have personally reviewed following labs and imaging studies  CBC: Recent Labs  Lab 06/01/18 0103 06/02/18 1025 06/03/18 0705 06/04/18 0746  WBC 6.7 5.9 4.0 4.0  NEUTROABS 3.2  --   --   --   HGB 15.8* 10.9* 10.6* 10.7*  HCT 52.0* 37.5 35.8* 35.6*  MCV 90.1 91.2 88.6 86.4  PLT 92* 92* 65* 70*   Basic Metabolic Panel: Recent Labs  Lab 06/01/18 0935 06/02/18 1020 06/03/18 0705 06/04/18 0746 06/05/18 0520  NA 152* 150* 151* 147* 146*  K 3.6 3.2* 3.6 3.4* 3.7  CL 120* 118* 119* 117* 118*  CO2 22 22 22  21* 24  GLUCOSE 84 80 108* 109* 139*  BUN 17 13 10  <5* <5*  CREATININE 1.34* 1.17* 1.01* 0.74 0.85  CALCIUM 8.8* 8.6* 8.5* 8.8* 8.5*  MG  --  1.8 1.8  --   --   PHOS  --  3.1  --   --   --    GFR: CrCl cannot be calculated (Unknown ideal weight.). Liver Function Tests: Recent Labs  Lab 06/01/18 0103 06/02/18 1020 06/03/18 0705  AST 53* 55* 58*  ALT 22 21 21   ALKPHOS 119 101 99  BILITOT 1.0 1.2 0.9  PROT 9.0* 7.9 7.2  ALBUMIN 2.8* 2.5* 2.4*   No results for input(s): LIPASE, AMYLASE in the last 168 hours. Recent Labs  Lab 06/03/18 0705  AMMONIA 39*   Coagulation Profile: No results for input(s):  INR, PROTIME in the last 168 hours. Cardiac Enzymes: No results for input(s): CKTOTAL, CKMB, CKMBINDEX, TROPONINI in the last 168 hours. BNP (last 3 results) No results for input(s): PROBNP in the last 8760 hours. HbA1C: No results for input(s): HGBA1C in the last 72 hours. CBG: Recent Labs  Lab 06/01/18 0052  GLUCAP 89   Lipid Profile: No results for input(s): CHOL, HDL, LDLCALC, TRIG, CHOLHDL, LDLDIRECT in the last 72 hours. Thyroid Function Tests: No results for input(s): TSH, T4TOTAL, FREET4, T3FREE, THYROIDAB in the last 72 hours. Anemia Panel: Recent Labs    06/04/18 0746  VITAMINB12 1,947*  FOLATE 24.8  FERRITIN 446*  TIBC 113*  IRON 66  RETICCTPCT 1.3   Sepsis Labs: Recent Labs  Lab 06/01/18 0154 06/01/18 0406 06/01/18 0935  LATICACIDVEN 3.1* 3.4* 2.1*    Recent Results (from the past 240 hour(s))  Culture, Urine     Status: Abnormal   Collection Time: 06/01/18  2:03 AM  Result Value Ref Range Status   Specimen Description URINE, RANDOM  Final  Special Requests   Final    NONE Performed at [facility], [address], [place], [place] [address]    Culture >=100,000 COLONIES/mL KLEBSIELLA PNEUMONIAE (A)  Final   Report Status 06/04/2018 FINAL  Final   Organism ID, Bacteria KLEBSIELLA PNEUMONIAE (A)  Final      Susceptibility   Klebsiella pneumoniae - MIC*    AMPICILLIN RESISTANT Resistant     CEFAZOLIN <=4 SENSITIVE Sensitive     CEFTRIAXONE <=1 SENSITIVE Sensitive     CIPROFLOXACIN <=0.25 SENSITIVE Sensitive     GENTAMICIN <=1 SENSITIVE Sensitive     IMIPENEM <=0.25 SENSITIVE Sensitive     NITROFURANTOIN 64 INTERMEDIATE Intermediate     TRIMETH/SULFA <=20 SENSITIVE Sensitive     AMPICILLIN/SULBACTAM 4 SENSITIVE Sensitive     PIP/TAZO <=4 SENSITIVE Sensitive     Extended ESBL NEGATIVE Sensitive     * >=100,000 COLONIES/mL KLEBSIELLA PNEUMONIAE  MRSA PCR Screening     Status: None   Collection Time: 06/01/18  5:52 AM  Result Value Ref  Range Status   MRSA by PCR NEGATIVE NEGATIVE Final    Comment:        The GeneXpert MRSA Assay (FDA approved for NASAL specimens only), is one component of a comprehensive MRSA colonization surveillance program. It is not intended to diagnose MRSA infection nor to guide or monitor treatment for MRSA infections. Performed at [facility], [address] [place], [place] [address]          Radiology Studies: No results found.      Scheduled Meds: . donepezil  10 mg Oral QHS  . PHENObarbital  65 mg Intravenous QHS  . polyvinyl alcohol  1 drop Both Eyes TID AC & HS  . sodium chloride flush  10-40 mL Intracatheter Q12H  . [START ON 06/08/2018] thiamine injection  100 mg Intravenous Daily   Continuous Infusions: . dextrose 5 % with KCl 20 mEq / L 20 mEq (06/05/18 0442)  . lacosamide (VIMPAT) IV 100 mg (06/04/18 2128)  . levETIRAcetam 1,500 mg (06/04/18 2259)  . sulfamethoxazole-trimethoprim    . thiamine injection       LOS: 4 days    Time spent: 35    Delaine Lame, MD [facility]  If 7PM-7AM, please contact night-coverage [email or web address] Password [email or web address] 06/05/2018, 10:24 AM

## 2018-06-05 NOTE — Progress Notes (Addendum)
NEUROLOGY PROGRESS NOTE  Subjective: Patient still remains encephalopathic.  She is more alert today however.  Although she is very bradyphrenic she is able to count fingers, follow me in the room and follow some commands.  Exam: Vitals:   06/05/18 0019 06/05/18 0336  BP: 137/65 (!) 152/87  Pulse:  71  Resp: 17 (!) 21  Temp: 98.4 F (36.9 C) 98.2 F (36.8 C)  SpO2: 99% 98%    Physical Exam   HEENT-  Normocephalic, no lesions, without obvious abnormality.  Normal external eye and conjunctiva.  Extremities- Warm, dry and intact Musculoskeletal-no joint tenderness, deformity or swelling Skin-warm and dry, no hyperpigmentation, vitiligo, or suspicious lesions    Neuro:  Mental Status: Patient is alert, moans initially but then able to count my fingers and track my fingers along with follow very simple commands such as showing me her thumb and wiggling her toes on the right side only Cranial Nerves: II: Blinks to threat bilaterally III,IV, VI:  extra-ocular motions intact bilaterally pupils equal, round, reactive to light and accommodation V,VII: Face symmetric, VIII: hearing normal bilaterally Motor: No movement of the left arm and leg.  She does have the ability to slightly raise her right arm and show me her thumb on the right.  She is unable to lift her right leg but does dorsiflex and plantarflex her ankle along with wiggling her toes.  When passively moving her arms and legs she moans in pain.  When moving her neck it appears stiff and she does moan in pain; however the majority of her extremities are also increased tone and she moans in pain with movement of them as well.  Sensory: Moans with pain in all extremities to noxious stimuli Deep Tendon Reflexes: 3+ at biceps, brachioradialis and knee jerks with no ankle jerks; no clonus Plantars: Right: downgoing   Left: downgoing   Medications:  Scheduled: . donepezil  10 mg Oral QHS  . PHENObarbital  65 mg Intravenous QHS   . polyvinyl alcohol  1 drop Both Eyes TID AC & HS  . sodium chloride flush  10-40 mL Intracatheter Q12H   Continuous: . dextrose 5 % with KCl 20 mEq / L 20 mEq (06/05/18 0442)  . lacosamide (VIMPAT) IV 100 mg (06/04/18 2128)  . levETIRAcetam 1,500 mg (06/04/18 2259)  . sulfamethoxazole-trimethoprim     PRN:[DISCONTINUED] acetaminophen **OR** acetaminophen, acetaminophen, LORazepam, ondansetron (ZOFRAN) IV, sodium chloride flush  Pertinent Labs/Diagnostics: -Sodium 146 -Chloride 118 -Glucose 139 -Calcium 8.5 -B12 1947 -White blood cell count 4.0 - AST 58 -ALT 21 -Ammonia 39 - TSH 0.828 - Thiamine pending    Ana Morn PA-C [facility] [phone]   Assessment: 63 year old female with history of CVA, right subdural hematoma and epilepsy.  Patient resides at a SNF and is on phenobarbital and Keppra.  Patient did have a breakthrough seizure in the hospital in the setting of a UTI. - UTI most likely has decreased patient's seizure threshold - Neurological exam findings most consistent with a delirium plus/minus postictal state which is slowly improving. - EEG showed slowing consistent with encephalopathy; no clinical seizures. - Neck is slightly stiff however throughout her body she is slightly stiff also. No fever or white count.   Recommendations: -Continue Vimpat, Keppra and phenobarbital. -Continue seizure precautions -We will start high-dose thiamine after thiamine blood draw has been obtained.   - Frequent neuro checks - Neurology will continue to follow   Electronically signed: Dr. Caryl Pina 06/05/2018, 10:09 AM

## 2018-06-05 NOTE — Plan of Care (Signed)
  Problem: Coping: Goal: Level of anxiety will decrease Outcome: Progressing   Problem: Nutrition: Goal: Adequate nutrition will be maintained Outcome: Not Progressing   Problem: Elimination: Goal: Will not experience complications related to bowel motility Outcome: Progressing   Problem: Coping: Goal: Level of anxiety will decrease Outcome: Progressing

## 2018-06-06 LAB — CBC
HCT: 34.9 % — ABNORMAL LOW (ref 36.0–46.0)
Hemoglobin: 11.2 g/dL — ABNORMAL LOW (ref 12.0–15.0)
MCH: 27.4 pg (ref 26.0–34.0)
MCHC: 32.1 g/dL (ref 30.0–36.0)
MCV: 85.3 fL (ref 80.0–100.0)
Platelets: 59 10*3/uL — ABNORMAL LOW (ref 150–400)
RBC: 4.09 MIL/uL (ref 3.87–5.11)
RDW: 17.4 % — ABNORMAL HIGH (ref 11.5–15.5)
WBC: 5 10*3/uL (ref 4.0–10.5)
nRBC: 0.4 % — ABNORMAL HIGH (ref 0.0–0.2)

## 2018-06-06 LAB — BASIC METABOLIC PANEL
Anion gap: 11 (ref 5–15)
BUN: <5 mg/dL — ABNORMAL LOW (ref 8–23)
CO2: 19 mmol/L — ABNORMAL LOW (ref 22–32)
Calcium: 8.7 mg/dL — ABNORMAL LOW (ref 8.9–10.3)
Chloride: 116 mmol/L — ABNORMAL HIGH (ref 98–111)
GFR calc Af Amer: >60 mL/min (ref >60)
GFR calc non Af Amer: >60 mL/min (ref >60)
Glucose, Bld: 89 mg/dL (ref 70–99)
Potassium: 4.1 mmol/L (ref 3.5–5.1)
Sodium: 146 mmol/L — ABNORMAL HIGH (ref 135–145)

## 2018-06-06 LAB — BASIC METABOLIC PANEL WITH GFR: Creatinine, Ser: 0.88 mg/dL (ref 0.44–1.00)

## 2018-06-06 MED ORDER — PRAVASTATIN SODIUM 40 MG PO TABS
80.0000 mg | ORAL_TABLET | Freq: Every day | ORAL | Status: DC
  Administered 2018-06-06 – 2018-06-07 (×2): 80 mg via ORAL
  Filled 2018-06-06 (×2): qty 2

## 2018-06-06 MED ORDER — LEVETIRACETAM 100 MG/ML PO SOLN
1500.0000 mg | Freq: Two times a day (BID) | ORAL | Status: DC
  Administered 2018-06-06 – 2018-06-07 (×3): 1500 mg via ORAL
  Filled 2018-06-06 (×4): qty 15

## 2018-06-06 MED ORDER — LOSARTAN POTASSIUM 25 MG PO TABS
25.0000 mg | ORAL_TABLET | Freq: Every day | ORAL | Status: DC
  Administered 2018-06-06 – 2018-06-07 (×2): 25 mg via ORAL
  Filled 2018-06-06 (×2): qty 1

## 2018-06-06 MED ORDER — LEVETIRACETAM 750 MG PO TABS: 1500.0000 mg | ORAL_TABLET | Freq: Two times a day (BID) | ORAL | Status: DC

## 2018-06-06 MED ORDER — LORAZEPAM 0.5 MG PO TABS
0.5000 mg | ORAL_TABLET | Freq: Two times a day (BID) | ORAL | Status: DC
  Administered 2018-06-06 – 2018-06-07 (×4): 0.5 mg via ORAL
  Filled 2018-06-06 (×4): qty 1

## 2018-06-06 MED ORDER — CLONIDINE HCL 0.1 MG PO TABS
0.1000 mg | ORAL_TABLET | Freq: Three times a day (TID) | ORAL | Status: DC
  Administered 2018-06-06 – 2018-06-07 (×6): 0.1 mg via ORAL
  Filled 2018-06-06 (×6): qty 1

## 2018-06-06 MED ORDER — PHENOBARBITAL 32.4 MG PO TABS
64.8000 mg | ORAL_TABLET | Freq: Every day | ORAL | Status: DC
  Administered 2018-06-06 – 2018-06-07 (×2): 64.8 mg via ORAL
  Filled 2018-06-06 (×2): qty 2

## 2018-06-06 MED ORDER — PHENOBARBITAL 20 MG/5ML PO ELIX: 60.0000 mg | ORAL_SOLUTION | Freq: Every day | ORAL | Status: DC

## 2018-06-06 MED ORDER — SUCRALFATE 1 G PO TABS: 1.0000 g | ORAL_TABLET | Freq: Three times a day (TID) | ORAL | Status: DC | PRN

## 2018-06-06 MED ORDER — LACOSAMIDE 50 MG PO TABS
100.0000 mg | ORAL_TABLET | Freq: Two times a day (BID) | ORAL | Status: DC
  Administered 2018-06-06 – 2018-06-07 (×4): 100 mg via ORAL
  Filled 2018-06-06 (×4): qty 2

## 2018-06-06 MED ORDER — SERTRALINE HCL 50 MG PO TABS
50.0000 mg | ORAL_TABLET | Freq: Every day | ORAL | Status: DC
  Administered 2018-06-06 – 2018-06-07 (×2): 50 mg via ORAL
  Filled 2018-06-06 (×2): qty 1

## 2018-06-06 NOTE — NC FL2 (Signed)
[place] MEDICAID FL2 LEVEL OF CARE SCREENING TOOL     IDENTIFICATION  Patient Name: Ana Collins Birthdate: 12-26-1955 Sex: female Admission Date (Current Location): 06/01/2018  [facility] [place]   Facility and Address:  [facility] [place], [address], [place], [place] [address]      Provider Number: [id number]  Attending Physician Name and Address:  Delaine LamePurohit, Shrey C, MD  Relative Name and Phone Number:       Current Level of Care: Hospital Recommended Level of Care: Skilled Nursing Facility Prior Approval Number:    Date Approved/Denied:   PASRR Number:    Discharge Plan: SNF    Current Diagnoses: Patient Active Problem List   Diagnosis Date Noted  . Acute subdural hematoma (HCC) 06/01/2018  . UTI (urinary tract infection) 06/01/2018  . Hypernatremia 06/01/2018  . Acute encephalopathy 06/01/2018  . Hypotension 06/01/2018  . AKI (acute kidney injury) (HCC) 06/01/2018    Orientation RESPIRATION BLADDER Height & Weight     Self  Normal Incontinent Weight: 184 lb 8.4 oz (83.7 kg) Height:     BEHAVIORAL SYMPTOMS/MOOD NEUROLOGICAL BOWEL NUTRITION STATUS      Incontinent Diet(see DC summary)  AMBULATORY STATUS COMMUNICATION OF NEEDS Skin   Extensive Assist Verbally Normal                       Personal Care Assistance Level of Assistance  Bathing, Feeding, Dressing Bathing Assistance: Maximum assistance Feeding assistance: Maximum assistance Dressing Assistance: Maximum assistance     Functional Limitations Info  Hearing, Sight, Speech Sight Info: Adequate Hearing Info: Adequate Speech Info: Impaired(dysarthria)    SPECIAL CARE FACTORS FREQUENCY  Speech therapy             Speech Therapy Frequency: 3x/wk      Contractures Contractures Info: Not present    Additional Factors Info  Code Status, Allergies, Psychotropic Code Status Info: Full Allergies Info: Latex, Penicillins Psychotropic Info:  Ativan 0.5mg  2x/day; Zoloft 50mg  daily         Current Medications (06/06/2018):  This is the current hospital active medication list Current Facility-Administered Medications  Medication Dose Route Frequency Provider Last Rate Last Dose  . acetaminophen (TYLENOL) suppository 650 mg  650 mg Rectal Q6H PRN [name], Vasundhra, MD      . acetaminophen (TYLENOL) tablet 650 mg  650 mg Oral Q6H PRN Lonia BloodMcClung, Jeffrey T, MD   650 mg at 06/05/18 2105  . cloNIDine (CATAPRES) tablet 0.1 mg  0.1 mg Oral TID Purohit, Shrey C, MD   0.1 mg at 06/06/18 1112  . dextrose 5 % with KCl 20 mEq / L  infusion  20 mEq Intravenous Continuous Lonia BloodMcClung, Jeffrey T, MD 100 mL/hr at 06/06/18 0342 20 mEq at 06/06/18 0342  . donepezil (ARICEPT) tablet 10 mg  10 mg Oral QHS Lonia BloodMcClung, Jeffrey T, MD   10 mg at 06/05/18 2104  . lacosamide (VIMPAT) tablet 100 mg  100 mg Oral BID Purohit, Shrey C, MD   100 mg at 06/06/18 1112  . levETIRAcetam (KEPPRA) 100 MG/ML solution 1,500 mg  1,500 mg Oral BID Purohit, Shrey C, MD      . LORazepam (ATIVAN) injection 2 mg  2 mg Intravenous Q4H PRN Lonia BloodMcClung, Jeffrey T, MD   2 mg at 06/04/18 0942  . LORazepam (ATIVAN) tablet 0.5 mg  0.5 mg Oral BID Purohit, Shrey C, MD   0.5 mg at 06/06/18 1112  . losartan (COZAAR)  tablet 25 mg  25 mg Oral Daily Purohit, Shrey C, MD   25 mg at 06/06/18 1326  . ondansetron (ZOFRAN) injection 4 mg  4 mg Intravenous Q6H PRN John Giovanni, MD      . PHENobarbital (LUMINAL) tablet 64.8 mg  64.8 mg Oral QHS Purohit, Shrey C, MD      . polyvinyl alcohol (LIQUIFILM TEARS) 1.4 % ophthalmic solution 1 drop  1 drop Both Eyes TID AC & HS John Giovanni, MD   1 drop at 06/06/18 1004  . pravastatin (PRAVACHOL) tablet 80 mg  80 mg Oral q1800 Purohit, Shrey C, MD      . sertraline (ZOLOFT) tablet 50 mg  50 mg Oral Daily Purohit, Shrey C, MD   50 mg at 06/06/18 1326  . sodium chloride flush (NS) 0.9 % injection 10-40 mL  10-40 mL Intracatheter Q12H Lonia Blood, MD   10  mL at 06/04/18 2259  . sodium chloride flush (NS) 0.9 % injection 10-40 mL  10-40 mL Intracatheter PRN Lonia Blood, MD      . sucralfate (CARAFATE) tablet 1 g  1 g Oral TID PRN Purohit, Salli Quarry, MD      . Melene Muller ON 06/08/2018] thiamine (B-1) injection 100 mg  100 mg Intravenous Daily Ulice Dash, PA-C      . thiamine 500mg  in normal saline (57ml) IVPB  500 mg Intravenous TID Ulice Dash, PA-C 100 mL/hr at 06/06/18 1111 500 mg at 06/06/18 1111     Discharge Medications: Please see discharge summary for a list of discharge medications.  Relevant Imaging Results:  Relevant Lab Results:   Additional Information SS#: [id number]  Baldemar Lenis, LCSW

## 2018-06-06 NOTE — Progress Notes (Signed)
Patient consumed 75% of lunch meal puree diet. Tolerated new diet well. Patient verbalized when she felt full. Oral care performed after completion of meal. Head of bed elevated at this time.

## 2018-06-06 NOTE — Plan of Care (Signed)
Pt's PO intake has improved today; Pt was successful at eating portions of both her breakfast and lunch.

## 2018-06-06 NOTE — Plan of Care (Signed)
Patient consumed at least 50% of breakfast tray and drank a whole bottle of vanilla ensure. Lunch tray just arrived. Will assist with feeding.

## 2018-06-06 NOTE — Progress Notes (Signed)
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 for evaluation of hypotension and altered mental status and found to have hypernatremia.  Assessment & Plan:   Principal Problem:   Acute subdural hematoma (HCC) Active Problems:   UTI (urinary tract infection)   Hypernatremia   Acute encephalopathy   Hypotension   AKI (acute kidney injury) (HCC)   #) Altered mental status: This appears to be gradually improving with IV hydration and possible treatment of UTI.  Unclear if this was postictal state or possible seizure from UTI -Neurology following, appreciate recommendations -EEG shows only diffuse slowing  #) Hypernatremia: Improving with D5W -Continue D5W  #) OBS:JGGEZ culture grew out relatively sensitive Klebsiella -Last dose of Bactrim today  #) Acute subdural hematoma: CT head showed subdural hematomas that were stable on repeat imaging.  Neurosurgery did not feel that these warranted intervention.  #) Dysphagia: Resolved and improving.  Patient is taking more p.o.  #) Seizure disorder: Currently patient is not actively seizing but has had episodes of seizure-like activity -Neurology following, appreciate recommendations -Continue PO levetiracetam thousand milligrams twice daily - Continue phenobarbital 65 mg nightly -Continue lacosamide 100 mg every 12 hours  #) History of CVA: Patient has on imaging at least evidence of CVAs of both the temporal lobe as well as brainstem raising. - Holding aspirin 325 mg daily due to subdural hematoma, can likely restart in 2 weeks  #) Atrial fibrillation status post pacer: -Per above not a candidate  for anticoagulation due to subdural hematoma  #) Hypertension/hyperlipidemia: - Hold aspirin 325 mg daily - Continue pravastatin 80 mg daily -Continue losartan  #) COPD: -Continue L AMA/LABA  #) Chronic diastolic heart failure: -Hold furosemide 60 mg daily  #) Pain/psych: -Continue sertraline 50 mg daily -Continue lorazepam 0.5 mg twice daily  #) Dementia: -Continue donepezil 10 mg nightly  Fluids: Holding Electrolytes: Monitor and supplement Nutrition: Per above  Prophylaxis: SCDs  Disposition: Pending resolution of altered mental status  Full code   Consultants:   Neurology  Procedures:  06/02/2018 MOQ:HUTM EEG is characterized by slowing which is consistent with normal drowse/sleep.  Can not rule out the possibility of slowing related to general cerebral disturbance such as a metabolic encephalopathy.  Clinical correlation recommended.  No epileptiform activity is noted.    Antimicrobials:   IV Bactrim started 06/04/2018 2 06/06/2018   Subjective: This morning the patient reports she is doing well.  She is slowed but much improved.  She apparently was taking a good amount of p.o.  She otherwise reports no pain.  She continues to be altered and quite slowed and ataxic.  Objective: Vitals:   06/05/18 2019 06/05/18 2334 06/06/18 0416 06/06/18 0752  BP: 117/60 135/68 (!) 155/87 138/72  Pulse:  63 65 68  Resp: 18 16 (!) 21   Temp: 98.3 F (36.8 C) 98.4 F (36.9 C) 98.6 F (37 C) 98.2 F (36.8 C)  TempSrc: Axillary Axillary Axillary Oral  SpO2: 95% 93% 98% 100%  Weight:        Intake/Output Summary (Last 24 hours) at 06/06/2018 1036 Last data filed at 06/06/2018 0600 Gross per 24 hour  Intake 2554.6 ml  Output -  Net 2554.6 ml   [employer]  06/01/18 0600 06/02/18 1030  Weight: 83.7 kg 83.7 kg    Examination:  General exam: Does not appear to be in distress Respiratory system: Clear to auscultation. Respiratory effort normal. Cardiovascular system:  Regular rate and rhythm, no murmurs Gastrointestinal system: Abdomen is nondistended, soft and nontender. No organomegaly or masses felt. Normal bowel sounds heard. Central nervous system: Alert but not oriented to situation, bradycardia phrenic, does not move left side, mildly hyperreflexic on left side, strength on right side is 4 out of 5, symmetric face Extremities: No lower extremity edema r. Skin: No rashes over visible skin Psychiatry: J unable to assess due to underlying medical condition    Data Reviewed: I have personally reviewed following labs and imaging studies  CBC: Recent Labs  Lab 06/01/18 0103 06/02/18 1025 06/03/18 0705 06/04/18 0746 06/06/18 0520  WBC 6.7 5.9 4.0 4.0 5.0  NEUTROABS 3.2  --   --   --   --   HGB 15.8* 10.9* 10.6* 10.7* 11.2*  HCT 52.0* 37.5 35.8* 35.6* 34.9*  MCV 90.1 91.2 88.6 86.4 85.3  PLT 92* 92* 65* 70* 59*   Basic Metabolic Panel: Recent Labs  Lab 06/02/18 1020 06/03/18 0705 06/04/18 0746 06/05/18 0520 06/06/18 0520  NA 150* 151* 147* 146* 146*  K 3.2* 3.6 3.4* 3.7 4.1  CL 118* 119* 117* 118* 116*  CO2 22 22 21* 24 19*  GLUCOSE 80 108* 109* 139* 89  BUN 13 10 <5* <5* <5*  CREATININE 1.17* 1.01* 0.74 0.85 0.88  CALCIUM 8.6* 8.5* 8.8* 8.5* 8.7*  MG 1.8 1.8  --   --   --   PHOS 3.1  --   --   --   --    GFR: CrCl cannot be calculated (Unknown ideal weight.). Liver Function Tests: Recent Labs  Lab 06/01/18 0103 06/02/18 1020 06/03/18 0705  AST 53* 55* 58*  ALT 22 21 21   ALKPHOS 119 101 99  BILITOT 1.0 1.2 0.9  PROT 9.0* 7.9 7.2  ALBUMIN 2.8* 2.5* 2.4*   No results for input(s): LIPASE, AMYLASE in the last 168 hours. Recent Labs  Lab 06/03/18 0705  AMMONIA 39*   Coagulation Profile: No results for input(s): INR, PROTIME in the last 168 hours. Cardiac Enzymes: No results for input(s): CKTOTAL, CKMB, CKMBINDEX, TROPONINI in the last 168 hours. BNP (last 3 results) No results for input(s): PROBNP in the last 8760  hours. HbA1C: No results for input(s): HGBA1C in the last 72 hours. CBG: Recent Labs  Lab 06/01/18 0052  GLUCAP 89   Lipid Profile: No results for input(s): CHOL, HDL, LDLCALC, TRIG, CHOLHDL, LDLDIRECT in the last 72 hours. Thyroid Function Tests: No results for input(s): TSH, T4TOTAL, FREET4, T3FREE, THYROIDAB in the last 72 hours. Anemia Panel: Recent Labs    06/04/18 0746  VITAMINB12 1,947*  FOLATE 24.8  FERRITIN 446*  TIBC 113*  IRON 66  RETICCTPCT 1.3   Sepsis Labs: Recent Labs  Lab 06/01/18 0154 06/01/18 0406 06/01/18 0935  LATICACIDVEN 3.1* 3.4* 2.1*    Recent Results (from the past 240 hour(s))  Culture, Urine     Status: Abnormal   Collection Time: 06/01/18  2:03 AM  Result Value Ref Range Status   Specimen Description URINE, RANDOM  Final   Special Requests   Final    NONE Performed at [facility], [address] [place], [place] [address]    Culture >=100,000 COLONIES/mL KLEBSIELLA PNEUMONIAE (A)  Final   Report Status 06/04/2018 FINAL  Final   Organism ID, Bacteria KLEBSIELLA PNEUMONIAE (A)  Final      Susceptibility   Klebsiella pneumoniae - MIC*    AMPICILLIN RESISTANT Resistant     CEFAZOLIN <=4 SENSITIVE Sensitive     CEFTRIAXONE <=1 SENSITIVE Sensitive     CIPROFLOXACIN <=0.25 SENSITIVE Sensitive     GENTAMICIN <=1 SENSITIVE Sensitive     IMIPENEM <=0.25 SENSITIVE Sensitive     NITROFURANTOIN 64 INTERMEDIATE Intermediate     TRIMETH/SULFA <=20 SENSITIVE Sensitive     AMPICILLIN/SULBACTAM 4 SENSITIVE Sensitive     PIP/TAZO <=4 SENSITIVE Sensitive     Extended ESBL NEGATIVE Sensitive     * >=100,000 COLONIES/mL KLEBSIELLA PNEUMONIAE  MRSA PCR Screening     Status: None   Collection Time: 06/01/18  5:52 AM  Result Value Ref Range Status   MRSA by PCR NEGATIVE NEGATIVE Final    Comment:        The GeneXpert MRSA Assay (FDA approved for NASAL specimens only), is one component of a comprehensive MRSA colonization surveillance  program. It is not intended to diagnose MRSA infection nor to guide or monitor treatment for MRSA infections. Performed at [facility], [address] [place], [place] [address]          Radiology Studies: No results found.      Scheduled Meds: . cloNIDine  0.1 mg Oral TID  . donepezil  10 mg Oral QHS  . lacosamide  100 mg Oral BID  . levETIRAcetam  1,500 mg Oral BID  . LORazepam  0.5 mg Oral BID  . losartan  25 mg Oral Daily  . PHENobarbital  64.8 mg Oral QHS  . polyvinyl alcohol  1 drop Both Eyes TID AC & HS  . pravastatin  80 mg Oral Daily  . sertraline  50 mg Oral Daily  . sodium chloride flush  10-40 mL Intracatheter Q12H  . [START ON 06/08/2018] thiamine injection  100 mg Intravenous Daily   Continuous Infusions: . dextrose 5 % with KCl 20 mEq / L 20 mEq (06/06/18 0342)  . thiamine injection 500 mg (06/05/18 2109)     LOS: 5 days    Time spent: 35    Delaine LameShrey C Alaysia Lightle, MD [facility]  If 7PM-7AM, please contact night-coverage [email or web address] Password [email or web address] 06/06/2018, 10:36 AM

## 2018-06-06 NOTE — Progress Notes (Signed)
Subjective: Cognition is improved today.   Objective: Current vital signs: BP 138/72 (BP Location: Left Leg)   Pulse 68   Temp 98.2 F (36.8 C) (Oral)   Resp (!) 21   Wt 83.7 kg   SpO2 100%  Vital signs in last 24 hours: Temp:  [98.1 F (36.7 C)-98.6 F (37 C)] 98.2 F (36.8 C) (02/06 0752) Pulse Rate:  [63-74] 68 (02/06 0752) Resp:  [15-21] 21 (02/06 0416) BP: (117-156)/(60-87) 138/72 (02/06 0752) SpO2:  [93 %-100 %] 100 % (02/06 0752)  Intake/Output from previous day: 02/05 0701 - 02/06 0700 In: 2554.6 [I.V.:1094.7; IV Piggyback:1459.9] Out: -  Intake/Output this shift: No intake/output data recorded. Nutritional status:  Diet Order            Diet full liquid Room service appropriate? Yes; Fluid consistency: Nectar Thick  Diet effective now             HEENT: [place]/AT Lungs: Respirations unlabored. Frequent grunting noises. Ext: Warm and well perfused  Neurologic Exam: Ment: Awake and alert. Able to answer simple questions and follow simple commands. Able to name a thumb and a pinky. Some perseveration noted. Short replies to questions. Bradyphrenic.  CN: Able to track to left and right with saccadic pursuits. No facial droop.  Motor: 4+/5 RUE, 2/5 LUE. No change to lower extremities.  Reflexes: 3+ bilateral brachioradialis.   Lab Results: Results for orders placed or performed during the hospital encounter of 06/01/18 (from the past 48 hour(s))  Basic metabolic panel     Status: Abnormal   Collection Time: 06/05/18  5:20 AM  Result Value Ref Range   Sodium 146 (H) 135 - 145 mmol/L   Potassium 3.7 3.5 - 5.1 mmol/L   Chloride 118 (H) 98 - 111 mmol/L   CO2 24 22 - 32 mmol/L   Glucose, Bld 139 (H) 70 - 99 mg/dL   BUN <5 (L) 8 - 23 mg/dL   Creatinine, Ser 1.61 0.44 - 1.00 mg/dL   Calcium 8.5 (L) 8.9 - 10.3 mg/dL   GFR calc non Af Amer >60 >60 mL/min   GFR calc Af Amer >60 >60 mL/min   Anion gap 4 (L) 5 - 15    Comment: Performed at [facility],  [address], [place], [place] [address]  CBC     Status: Abnormal   Collection Time: 06/06/18  5:20 AM  Result Value Ref Range   WBC 5.0 4.0 - 10.5 K/uL   RBC 4.09 3.87 - 5.11 MIL/uL   Hemoglobin 11.2 (L) 12.0 - 15.0 g/dL   HCT 54.0 (L) 98.1 - 19.1 %   MCV 85.3 80.0 - 100.0 fL   MCH 27.4 26.0 - 34.0 pg   MCHC 32.1 30.0 - 36.0 g/dL   RDW 47.8 (H) 29.5 - 62.1 %   Platelets 59 (L) 150 - 400 K/uL    Comment: REPEATED TO VERIFY Immature Platelet Fraction may be clinically indicated, consider ordering this additional test [id number] CONSISTENT WITH PREVIOUS RESULT    nRBC 0.4 (H) 0.0 - 0.2 %    Comment: Performed at [facility], [address], [place], [place] [address]  Basic metabolic panel     Status: Abnormal   Collection Time: 06/06/18  5:20 AM  Result Value Ref Range   Sodium 146 (H) 135 - 145 mmol/L   Potassium 4.1 3.5 - 5.1 mmol/L   Chloride 116 (H) 98 - 111 mmol/L   CO2 19 (L) 22 -  32 mmol/L   Glucose, Bld 89 70 - 99 mg/dL   BUN <5 (L) 8 - 23 mg/dL   Creatinine, Ser 4.090.88 0.44 - 1.00 mg/dL   Calcium 8.7 (L) 8.9 - 10.3 mg/dL   GFR calc non Af Amer >60 >60 mL/min   GFR calc Af Amer >60 >60 mL/min   Anion gap 11 5 - 15    Comment: Performed at [facility], [address] [place], [place] [address]    Recent Results (from the past 240 hour(s))  Culture, Urine     Status: Abnormal   Collection Time: 06/01/18  2:03 AM  Result Value Ref Range Status   Specimen Description URINE, RANDOM  Final   Special Requests   Final    NONE Performed at [facility], [address] [place], [place] [address]    Culture >=100,000 COLONIES/mL KLEBSIELLA PNEUMONIAE (A)  Final   Report Status 06/04/2018 FINAL  Final   Organism ID, Bacteria KLEBSIELLA PNEUMONIAE (A)  Final      Susceptibility   Klebsiella pneumoniae - MIC*    AMPICILLIN RESISTANT Resistant     CEFAZOLIN <=4 SENSITIVE Sensitive     CEFTRIAXONE <=1 SENSITIVE Sensitive     CIPROFLOXACIN <=0.25  SENSITIVE Sensitive     GENTAMICIN <=1 SENSITIVE Sensitive     IMIPENEM <=0.25 SENSITIVE Sensitive     NITROFURANTOIN 64 INTERMEDIATE Intermediate     TRIMETH/SULFA <=20 SENSITIVE Sensitive     AMPICILLIN/SULBACTAM 4 SENSITIVE Sensitive     PIP/TAZO <=4 SENSITIVE Sensitive     Extended ESBL NEGATIVE Sensitive     * >=100,000 COLONIES/mL KLEBSIELLA PNEUMONIAE  MRSA PCR Screening     Status: None   Collection Time: 06/01/18  5:52 AM  Result Value Ref Range Status   MRSA by PCR NEGATIVE NEGATIVE Final    Comment:        The GeneXpert MRSA Assay (FDA approved for NASAL specimens only), is one component of a comprehensive MRSA colonization surveillance program. It is not intended to diagnose MRSA infection nor to guide or monitor treatment for MRSA infections. Performed at [facility], [address] [place], [place] [address]     Lipid Panel No results for input(s): CHOL, TRIG, HDL, CHOLHDL, VLDL, LDLCALC in the last 72 hours.  Studies/Results: No results found.  Medications:  Scheduled: . donepezil  10 mg Oral QHS  . PHENObarbital  65 mg Intravenous QHS  . polyvinyl alcohol  1 drop Both Eyes TID AC & HS  . sodium chloride flush  10-40 mL Intracatheter Q12H  . [START ON 06/08/2018] thiamine injection  100 mg Intravenous Daily   Continuous: . dextrose 5 % with KCl 20 mEq / L 20 mEq (06/06/18 0342)  . lacosamide (VIMPAT) IV 100 mg (06/05/18 2313)  . levETIRAcetam 1,500 mg (06/05/18 2146)  . sulfamethoxazole-trimethoprim 320 mg (06/05/18 2316)  . thiamine injection 500 mg (06/05/18 2109)    Assessment: 63 year old female with history of CVA, right subdural hematoma and epilepsy.  Patient resides at a SNF and is on phenobarbital and Keppra.  Patient did have a breakthrough seizure in the hospital in the setting of a UTI. EEG showed slowing. - UTI most likely has decreased patient's seizure threshold - Neurological exam findings most consistent with a dementia. No  longer with waxing/waning mentation but a mild delirium may still persist. Also possible that this is a gradually resolving postictal state.  Recommendations: - Continue Vimpat, Keppra and phenobarbital. -  Continue seizure precautions - Continue high-dose thiamine after thiamine blood draw has been obtained.   - Neurology will sign off. Please call if there are additional questions.    LOS: 5 days   @Electronically  signed: Dr. Caryl Pina 06/06/2018  8:31 AM

## 2018-06-06 NOTE — Progress Notes (Signed)
  Speech Language Pathology Treatment:    Patient Details Name: Ana BarrenLinda Barse MRN: [id number] DOB: [birth year] Today's Date: 06/06/2018 Time: [id number] SLP Time Calculation (min) (ACUTE ONLY): 33 min  Assessment / Plan / Recommendation Clinical Impression  Pt seen for skilled SLP treatment to address her dysphagia.  Mentation improved today as pt was fully alert when SLP arrived to room.  Pt even able to help self feed today - holding her own cup and spoon after SLP scooped food on spoon.  Therapeutic advanced po provided of graham cracker, thin water via straw.  Pt demonstrated presumed prolonged mastication up to 2 minutes despite max cues to swallow which will decrease swallow efficiency and increase aspiration risk.  Water boluses consumed at rapid rate with immediate throat clear and belching x3/7 trials.  Pt denies issues with refluxing at this time.  Due to positioning in bed *low bed* SLP placed pt in reverse Trendelenburg to allow optimal positioning.  Recommend advance diet to dys1/nectar and allow thin water between meals.  Pt will continue to require assist with po - but hand over hand implementation with min cues optimized swallow efficiency and thus safety.  Anticipate pt will return to baseline swallow ability as continues with medical progression. Dementia decreases pt's ability to follow strict compensation strategies. Thanks for allowing me to help care for this pt.   HPI HPI: 63yo F w/ a hx of vascular dementia, A. 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  It was reported to EMS the patient had not been voiding.  Facility reported she had not been eating or drinking for 2 days. CT of the head noted acute right subdural hematomas. Also with UTI.       SLP Plan  Continue with current  plan of care       Recommendations  Diet recommendations: Dysphagia 1 (puree);Nectar-thick liquid;Other(comment)(thin water between meals ok) Liquids provided via: Straw;Cup Medication Administration: Whole meds with puree(with puree) Supervision: Full supervision/cueing for compensatory strategies(hand over hand assist, have pt help feed herself) Compensations: Minimize environmental distractions;Slow rate;Small sips/bites(check for oral residuals on left) Postural Changes and/or Swallow Maneuvers: Seated upright 90 degrees;Upright 30-60 min after meal                Oral Care Recommendations: Oral care QID Follow up Recommendations: Skilled Nursing facility SLP Visit Diagnosis: Dysphagia, oropharyngeal phase (R13.12) Plan: Continue with current plan of care       GO              Donavan Burnetamara Colin Norment, MS [facility] SLP Acute Rehab Services Pager [phone] Office [phone]   Chales AbrahamsKimball, Kris No Ann 06/06/2018, 9:34 AM

## 2018-06-06 NOTE — Progress Notes (Signed)
  Pt tolerates Po intake when fully awake and HOB above 30 degrees.  however needs cues to swallow several times.   Pt. may need picc line  Patient  has only one lumen midline  for multiple PIGGY bags a antibiotic and anti seizure  Medications and difficult to draw am lab . Pt is a very hard stick as well, therefore may benefit from Cental line/ PiCC line.

## 2018-06-06 NOTE — Progress Notes (Signed)
Site assessment: R midline flushes easily, sluggish blood return noted. Site appears patent. Bruising noted to R upper arm above insertion site and in bend of elbow. Arm is soft and non-tender. OK to use midline. Please contact IV Team with any changes/ questions.

## 2018-06-07 DIAGNOSIS — E870 Hyperosmolality and hypernatremia: Secondary | ICD-10-CM

## 2018-06-07 DIAGNOSIS — R1319 Other dysphagia: Secondary | ICD-10-CM

## 2018-06-07 LAB — BASIC METABOLIC PANEL
Anion gap: 7 (ref 5–15)
CO2: 21 mmol/L — ABNORMAL LOW (ref 22–32)
Calcium: 8.6 mg/dL — ABNORMAL LOW (ref 8.9–10.3)
Chloride: 117 mmol/L — ABNORMAL HIGH (ref 98–111)
Creatinine, Ser: 0.97 mg/dL (ref 0.44–1.00)
GFR calc Af Amer: >60 mL/min (ref >60)
Glucose, Bld: 108 mg/dL — ABNORMAL HIGH (ref 70–99)
POTASSIUM: 4.1 mmol/L (ref 3.5–5.1)
Sodium: 145 mmol/L (ref 135–145)

## 2018-06-07 MED ORDER — ENSURE ENLIVE PO LIQD
237.0000 mL | Freq: Two times a day (BID) | ORAL | Status: DC
  Administered 2018-06-07: 237 mL via ORAL

## 2018-06-07 MED ORDER — RESOURCE THICKENUP CLEAR PO POWD
ORAL | Status: DC | PRN
  Filled 2018-06-07: qty 125

## 2018-06-07 MED ORDER — LACOSAMIDE 100 MG PO TABS: 100.0000 mg | ORAL_TABLET | Freq: Two times a day (BID) | ORAL | 1 refills | Status: DC

## 2018-06-07 MED ORDER — LORAZEPAM 0.5 MG PO TABS: 0.5000 mg | ORAL_TABLET | Freq: Two times a day (BID) | ORAL | 0 refills | Status: DC

## 2018-06-07 NOTE — Discharge Summary (Signed)
Discharge Summary  Ana Collins JYN:[id number] DOB: [birth year]  PCP: Karna Dupes, MD  Admit date: 06/01/2018 Discharge date: 06/07/2018  Time spent: 35 minutes  Recommendations for Outpatient Follow-up:  1. New medication: Vimpat 2. Medication change: Aspirin discontinued 3. Lab work: Patient should have a basic metabolic panel with sodium level checked on 2/10 4. Recommend speech therapy evaluation in approximately 2 weeks to see if her diet can be upgraded.  Discharge Diagnoses:  Active Hospital Problems   Diagnosis Date Noted  . Acute subdural hematoma (HCC) 06/01/2018  . UTI (urinary tract infection) 06/01/2018  . Hypernatremia 06/01/2018  . Acute encephalopathy 06/01/2018  . Hypotension 06/01/2018  . AKI (acute kidney injury) (HCC) 06/01/2018    Resolved Hospital Problems  No resolved problems to display.    Discharge Condition: Stable, being discharged back to skilled nursing  Diet recommendation: Dysphagia 1 diet with nectar thick liquids.  Okay for sips of water in between meals.  Full aspiration precautions.  Vitals:   06/07/18 0759 06/07/18 1145  BP: (!) 111/49 109/67  Pulse: 60 63  Resp: 18 15  Temp: 98 F (36.7 C) 98.7 F (37.1 C)  SpO2: 99% 96%    History of present illness:  63 year old relevant for vascular dementia, atrial fibrillation status post pacemaker, chronic diastolic heart failure, hypertension, hyperlipidemia, seizure disorder, history of CVA with residual left-sided weakness and specific spasticity admitted from [facility] for evaluation of hypotension and altered mental status and found to have hypernatremia.  Hospital Course:  Principal Problem:   Acute subdural hematoma (HCC) Active Problems:   UTI (urinary tract infection)   Hypernatremia   Acute encephalopathy   Hypotension   AKI (acute kidney injury) (HCC) #) Altered mental status: This appears to be gradually improving with IV hydration and possible treatment of UTI.   Unclear if this was postictal state or possible seizure from UTI -Neurology following, appreciate recommendations.  She is much improved. -EEG shows only diffuse slowing  #) Hypernatremia: Improving with D5W Sodium at 147.  Given D5W.  By day of discharge, sodium down to 145.  She will have a repeat sodium level checked on 2/10.  Her Lasix is being resumed.  #) QMV:HQION culture grew out relatively sensitive Klebsiella -Last dose of Bactrim on 2/6.  #) Acute subdural hematoma: CT head showed subdural hematomas that were stable on repeat imaging.  Neurosurgery did not feel that these warranted intervention.  Her aspirin was held.  #) Dysphagia:  Still with some episodes of less than maximum mentation.  Seen by speech therapy who recommended dysphagia 1 with nectar thick liquids.  On day of discharge, they recommended no adjustment at this time.  Speech therapy will follow-up with patient at skilled nursing to see if her diet can be upgraded.  #) Seizure disorder: Seizures resolved.  EEG done noted no evidence of active seizures. -Neurology consulted -Continue PO levetiracetam thousand milligrams twice daily - Continue phenobarbital 65 mg nightly  lacosamide added as per neurology at 100 mg every 12 hours  #) History of CVA: Patient has on imaging at least evidence of CVAs of both the temporal lobe as well as brainstem raising. - Holding aspirin 325 mg daily due to subdural hematoma, can likely restart in 2 weeks  #) Atrial fibrillation status post pacer: -Per above not a candidate for anticoagulation due to subdural hematoma  #) Hypertension/hyperlipidemia: - Hold aspirin 325 mg daily - Continue pravastatin 80 mg daily -Continue losartan  #) COPD: -Continue L AMA/LABA  #)  Chronic diastolic heart failure: -Hold furosemide 60 mg daily initially.  Patient remains euvolemic.  Resume Lasix on discharge.  #) Pain/psych: -Continue sertraline 50 mg daily -Continue lorazepam  0.5 mg twice daily  #) Dementia: -Continue donepezil 10 mg nightly  Consultants:   Neurology  Procedures:  06/02/2018 HDQ:QIWL EEG is characterized by slowing which is consistent with normal drowse/sleep. Can not rule out the possibility of slowing related to general cerebral disturbance such as a metabolic encephalopathy. Clinical correlation recommended. No epileptiform activity is noted.   Discharge Exam: BP 109/67 (BP Location: Left Leg)   Pulse 63   Temp 98.7 F (37.1 C) (Axillary)   Resp 15   Wt 83.7 kg   SpO2 96%   General: Oriented x2, no acute distress Cardiovascular: Regular rate and rhythm, S1-S2 Respiratory: Clear to auscultation bilaterally  Discharge Instructions You were cared for by a hospitalist during your hospital stay. If you have any questions about your discharge medications or the care you received while you were in the hospital after you are discharged, you can call the unit and asked to speak with the hospitalist on call if the hospitalist that took care of you is not available. Once you are discharged, your primary care physician will handle any further medical issues. Please note that NO REFILLS for any discharge medications will be authorized once you are discharged, as it is imperative that you return to your primary care physician (or establish a relationship with a primary care physician if you do not have one) for your aftercare needs so that they can reassess your need for medications and monitor your lab values.  Discharge Instructions    DIET - DYS 1   Complete by:  As directed    (thin water sips between meals ok) Liquids provided via: Straw;Cup Medication Administration: Whole meds with puree(with puree) Supervision: Full supervision/cueing for compensatory strategies(hand over hand assist, have pt help feed herself) Compensations: Minimize environmental distractions;Slow rate;Small sips/bites(check for oral residuals on left)   Fluid  consistency:  Nectar Thick   Increase activity slowly   Complete by:  As directed      Allergies as of 06/07/2018      Reactions   Latex Other (See Comments)   ON MAR   Penicillins Other (See Comments)   ON MAR      Medication List    STOP taking these medications   aspirin 325 MG tablet     TAKE these medications   acetaminophen 325 MG tablet Commonly known as:  TYLENOL Take 650 mg by mouth 2 (two) times daily.   ANORO ELLIPTA IN Inhale 1 puff into the lungs daily.   B-complex with vitamin C tablet Take 1 tablet by mouth daily.   carboxymethylcellul-glycerin 0.5-0.9 % ophthalmic solution Commonly known as:  REFRESH OPTIVE Place 1 drop into both eyes 4 (four) times daily.   cloNIDine 0.1 MG tablet Commonly known as:  CATAPRES Take 0.1 mg by mouth 3 (three) times daily.   donepezil 10 MG tablet Commonly known as:  ARICEPT Take 10 mg by mouth at bedtime.   furosemide 80 MG tablet Commonly known as:  LASIX Take 60 mg by mouth daily.   HEMOCYTE-PLUS PO Take 1 capsule by mouth 2 (two) times daily.   Lacosamide 100 MG Tabs Take 1 tablet (100 mg total) by mouth 2 (two) times daily.   levETIRAcetam 100 MG/ML solution Commonly known as:  KEPPRA Take 15 mLs by mouth 2 (two) times daily.  LORazepam 0.5 MG tablet Commonly known as:  ATIVAN Take 1 tablet (0.5 mg total) by mouth 2 (two) times daily.   losartan 25 MG tablet Commonly known as:  COZAAR Take 25 mg by mouth daily.   multivitamin with minerals Tabs tablet Take 1 tablet by mouth daily.   omeprazole 20 MG capsule Commonly known as:  PRILOSEC Take 20 mg by mouth daily.   ondansetron 8 MG tablet Commonly known as:  ZOFRAN Take by mouth every 8 (eight) hours as needed for nausea or vomiting.   PHENobarbital 64.8 MG tablet Commonly known as:  LUMINAL Take 64.8 mg by mouth at bedtime.   potassium chloride 10 MEQ tablet Commonly known as:  K-DUR Take 20 mEq by mouth daily.   pravastatin 80 MG  tablet Commonly known as:  PRAVACHOL Take 80 mg by mouth daily.   RESOURCE 2.0 PO Take 1 Can by mouth 3 (three) times daily between meals.   sertraline 50 MG tablet Commonly known as:  ZOLOFT Take 50 mg by mouth daily.   sucralfate 1 g tablet Commonly known as:  CARAFATE Take 1 g by mouth 3 (three) times daily as needed Genella Rife(Gerd).      Allergies  Allergen Reactions  . Latex Other (See Comments)    ON MAR  . Penicillins Other (See Comments)    ON MAR   Contact information for after-discharge care    Destination    [facility] .   Service:  Skilled Nursing Contact information: [address] [place] [id number] [phone]               The results of significant diagnostics from this hospitalization (including imaging, microbiology, ancillary and laboratory) are listed below for reference.    Significant Diagnostic Studies: Dg Chest 1 View  Result Date: 06/01/2018 CLINICAL DATA:  Hypotension and altered mental status. EXAM: CHEST  1 VIEW COMPARISON:  None. FINDINGS: Cardiomegaly with aortic atherosclerosis. Hilar prominence likely representing prominent pulmonary vasculature is noted likely representing stigmata of pulmonary hypertension. No acute pulmonary consolidation, effusion or pneumothorax. Left-sided pacemaker apparatus with leads in the right atrium and right ventricle are noted. IMPRESSION: Cardiomegaly with aortic atherosclerosis. No acute pulmonary disease. Electronically Signed   By: Tollie Ethavid  Kwon M.D.   On: 06/01/2018 01:11   Ct Head Wo Contrast  Result Date: 06/02/2018 CLINICAL DATA:  63 year old female presenting with hypotension and altered mental status found to have right side subdural hematoma on head CT. EXAM: CT HEAD WITHOUT CONTRAST TECHNIQUE: Contiguous axial images were obtained from the base of the skull through the vertex without intravenous contrast. COMPARISON:  06/01/2018. FINDINGS: Brain: There does seem to be an  isodense right side subdural hematoma (coronal image 26) superimposed on multifocal areas of right hemisphere cystic encephalomalacia, and right lateral ventricle ex vacuo enlargement. Hematoma size is estimated at 4-5 millimeters, but there is no midline shift or significant intracranial mass effect. There is a superimposed small 5 millimeter parafalcine subdural hematoma suspected on series 3, image 25. In the left hemisphere there is confluent white matter hypodensity. There is generalized cerebral volume loss. There is thickening of the pituitary infundibulum (coronal image 29) with no intra sellar or suprasellar mass effect. No definite acute cortically based infarcts; right PCA territory hypodensity is favored to be chronic in light of right occipital horn enlargement. No new intracranial blood products. Vascular: Calcified atherosclerosis at the skull base. No suspicious intracranial vascular hyperdensity. Skull: Hyperostosis, a normal variant. No skull fracture or  acute osseous abnormality identified. Sinuses/Orbits: Right mastoid effusion and right frontal sinus bubbly opacity are stable. Tympanic cavities, left mastoids, and most of the other paranasal sinuses remain well pneumatized. Other: No acute orbit or scalp soft tissue finding. IMPRESSION: 1. Isodense right side subdural hematoma again suspected, 4-5 mm in thickness, with small 5 mm parafalcine subdural also. No intracranial mass effect.  No associated skull fracture. Multifocal encephalomalacia in the right hemisphere with ex vacuo ventricular enlargement. 2. Nonspecific thickening of the pituitary infundibulum. Query Diabetes Insipidus or Hypopituitarism. Electronically Signed   By: Odessa Fleming M.D.   On: 06/02/2018 14:26   Ct Head Wo Contrast  Result Date: 06/01/2018 CLINICAL DATA:  63 year old female with hypotension and altered mental status. EXAM: CT HEAD WITHOUT CONTRAST TECHNIQUE: Contiguous axial images were obtained from the base of the  skull through the vertex without intravenous contrast. COMPARISON:  None. FINDINGS: Brain: Acute right-sided subdural overlying the convexity the brain measuring up to 4.3 mm in thickness. Right parafalcine subdural hematoma is also noted measuring to 3.5 mm in thickness as well as the right tentorium measuring up to 1.4 mm. Remote appearing right temporal occipital infarct with adjacent encephalomalacia right lateral ventricle. An age-indeterminate brainstem infarct is also seen. Hyperdensities along the cerebellopontine angles compatible choroid plexus calcifications the sagittal view. Moderate degree of small vessel ischemia is otherwise noted. Age related involutional changes of brain are noted. Vascular: No hyperdense vessel sign. Skull: No acute fracture or suspicious osseous lesions. Sinuses/Orbits: Right frontal, right maxillary and ethmoid sinus mucosal thickening. Intact orbits and globes. Other: None IMPRESSION: 1. Acute right-sided subdural hematomas as above along the convexity of the brain, falx and right tentorium measuring up to 4.3 mm in thickness. 2. Chronic moderate small vessel ischemic disease. 3. Chronic right temporal occipital lobe infarct with adjacent encephalomalacia the right lateral ventricle. 4. Age-indeterminate brainstem infarct. These results were called by telephone at the time of interpretation on 06/01/2018 at 2:00 am to Dr. Geoffery Lyons , who verbally acknowledged these results. Electronically Signed   By: Tollie Eth M.D.   On: 06/01/2018 02:04   US Renal  Result Date: 06/01/2018 CLINICAL DATA:  Acute kidney injury. EXAM: RENAL / URINARY TRACT ULTRASOUND COMPLETE COMPARISON:  None. FINDINGS: Right Kidney: Renal measurements: 12.3 x 5.6 x 7.4 cm = volume: 267 mL . Echogenicity within normal limits. No mass or hydronephrosis visualized. Left Kidney: Renal measurements: 10.5 x 4.7 x 4.4 cm = volume: 112 mL. Echogenicity within normal limits. No mass or hydronephrosis visualized.  Bladder: Appears normal for degree of bladder distention. IMPRESSION: No significant renal abnormality seen. Electronically Signed   By: Lupita Raider, M.D.   On: 06/01/2018 07:22    Microbiology: Recent Results (from the past 240 hour(s))  Culture, Urine     Status: Abnormal   Collection Time: 06/01/18  2:03 AM  Result Value Ref Range Status   Specimen Description URINE, RANDOM  Final   Special Requests   Final    NONE Performed at [facility], [address], [place], [place] [address]    Culture >=100,000 COLONIES/mL KLEBSIELLA PNEUMONIAE (A)  Final   Report Status 06/04/2018 FINAL  Final   Organism ID, Bacteria KLEBSIELLA PNEUMONIAE (A)  Final      Susceptibility   Klebsiella pneumoniae - MIC*    AMPICILLIN RESISTANT Resistant     CEFAZOLIN <=4 SENSITIVE Sensitive     CEFTRIAXONE <=1 SENSITIVE Sensitive     CIPROFLOXACIN <=0.25 SENSITIVE Sensitive  GENTAMICIN <=1 SENSITIVE Sensitive     IMIPENEM <=0.25 SENSITIVE Sensitive     NITROFURANTOIN 64 INTERMEDIATE Intermediate     TRIMETH/SULFA <=20 SENSITIVE Sensitive     AMPICILLIN/SULBACTAM 4 SENSITIVE Sensitive     PIP/TAZO <=4 SENSITIVE Sensitive     Extended ESBL NEGATIVE Sensitive     * >=100,000 COLONIES/mL KLEBSIELLA PNEUMONIAE  MRSA PCR Screening     Status: None   Collection Time: 06/01/18  5:52 AM  Result Value Ref Range Status   MRSA by PCR NEGATIVE NEGATIVE Final    Comment:        The GeneXpert MRSA Assay (FDA approved for NASAL specimens only), is one component of a comprehensive MRSA colonization surveillance program. It is not intended to diagnose MRSA infection nor to guide or monitor treatment for MRSA infections. Performed at [facility], [address] [place], [place] [address]      Labs: Basic Metabolic Panel: Recent Labs  Lab 06/02/18 1020 06/03/18 0705 06/04/18 0746 06/05/18 0520 06/06/18 0520 06/07/18 1413  NA 150* 151* 147* 146* 146* 145  K 3.2* 3.6 3.4* 3.7 4.1  4.1  CL 118* 119* 117* 118* 116* 117*  CO2 22 22 21* 24 19* 21*  GLUCOSE 80 108* 109* 139* 89 108*  BUN 13 10 <5* <5* <5* <5*  CREATININE 1.17* 1.01* 0.74 0.85 0.88 0.97  CALCIUM 8.6* 8.5* 8.8* 8.5* 8.7* 8.6*  MG 1.8 1.8  --   --   --   --   PHOS 3.1  --   --   --   --   --    Liver Function Tests: Recent Labs  Lab 06/01/18 0103 06/02/18 1020 06/03/18 0705  AST 53* 55* 58*  ALT 22 21 21   ALKPHOS 119 101 99  BILITOT 1.0 1.2 0.9  PROT 9.0* 7.9 7.2  ALBUMIN 2.8* 2.5* 2.4*   No results for input(s): LIPASE, AMYLASE in the last 168 hours. Recent Labs  Lab 06/03/18 0705  AMMONIA 39*   CBC: Recent Labs  Lab 06/01/18 0103 06/02/18 1025 06/03/18 0705 06/04/18 0746 06/06/18 0520  WBC 6.7 5.9 4.0 4.0 5.0  NEUTROABS 3.2  --   --   --   --   HGB 15.8* 10.9* 10.6* 10.7* 11.2*  HCT 52.0* 37.5 35.8* 35.6* 34.9*  MCV 90.1 91.2 88.6 86.4 85.3  PLT 92* 92* 65* 70* 59*   Cardiac Enzymes: No results for input(s): CKTOTAL, CKMB, CKMBINDEX, TROPONINI in the last 168 hours. BNP: BNP (last 3 results) No results for input(s): BNP in the last 8760 hours.  ProBNP (last 3 results) No results for input(s): PROBNP in the last 8760 hours.  CBG: Recent Labs  Lab 06/01/18 0052  GLUCAP 89       Signed:  Hollice EspySendil K Copper Kirtley, MD [facility] 06/07/2018, 3:52 PM

## 2018-06-07 NOTE — Progress Notes (Signed)
Removed midline as per IV team, tolerated well.

## 2018-06-07 NOTE — Plan of Care (Signed)
Adequate for discharge.

## 2018-06-07 NOTE — Progress Notes (Signed)
Pt was asleep beginning of the shift, fully awake and alert around 2330 . Due medications given mouth care done dinner tray warmed up and pt ate 40 % with apple sauce,tolerated well .no pocketing noted mouth care done after meal, all needs met. Left arm remained swollen + 2 edema,levated on pillow. No seizure activity  noted except occasional tremor of B U E. Seizure precaution in place. RN will continue to monitor pt.

## 2018-06-07 NOTE — Progress Notes (Signed)
Report given to Juliet

## 2018-06-07 NOTE — Clinical Social Work Note (Signed)
Clinical Social Work Assessment  Patient Details  Name: Ana Collins MRN: [id number] Date of Birth: [birth year]  Date of referral:  06/07/18               Reason for consult:  Facility Placement                Permission sought to share information with:  Facility Medical sales representativeContact Representative, Family Supports Permission granted to share information::  Yes, Verbal Permission Granted  Name::     Ana Collins  Agency::  [facility]  Relationship::  Son  SolicitorContact Information:     Housing/Transportation Living arrangements for the past 2 months:  Skilled Building surveyorursing Facility Source of Information:  Medical Team, Adult Children, Facility Patient Interpreter Needed:  None Criminal Activity/Legal Involvement Pertinent to Current Situation/Hospitalization:  No - Comment as needed Significant Relationships:  Adult Children Lives with:  Self, Facility Resident Do you feel safe going back to the place where you live?  Yes Need for family participation in patient care:  Yes (Comment)  Care giving concerns:  [facility] from [facility] and family has no concerns about care received.   Social Worker assessment / plan:  CSW received confirmation from facility and family that patient to [facility]. Plan for discharge today.  Employment status:  Retired Health and safety inspectornsurance information:  Medicare PT Recommendations:  Not assessed at this time Information / Referral to community resources:  Skilled Nursing Facility  Patient/Family's Response to care:  Patient's son agreeable for patient to return to SNF.  Patient/Family's Understanding of and Emotional Response to Diagnosis, Current Treatment, and Prognosis:  Patient's son appreciative of update from CSW, and hopeful that MD can give him a call with any other recommendations for the patient's care. Patient's facility excited to have patient back.  Emotional Assessment Appearance:  Appears stated age Attitude/Demeanor/Rapport:  Unable to Assess Affect (typically  observed):  Unable to Assess Orientation:  Oriented to Self, Oriented to Place Alcohol / Substance use:  Not Applicable Psych involvement (Current and /or in the community):  No (Comment)  Discharge Needs  Concerns to be addressed:  Care Coordination Readmission within the last 30 days:  No Current discharge risk:  Physical Impairment, Dependent with Mobility Barriers to Discharge:  No Barriers Identified   Baldemar Lenislizabeth M Stephenie Navejas, LCSW 06/07/2018, 3:53 PM

## 2018-06-07 NOTE — Progress Notes (Signed)
  Speech Language Pathology Treatment: Dysphagia  Patient Details Name: Ana Collins MRN: [id number] DOB: 14-Feb-1956 Today's Date: 06/07/2018 Time: 6283-1517 SLP Time Calculation (min) (ACUTE ONLY): 31 min  Assessment / Plan / Recommendation Clinical Impression  Pt today awake but is not verbally responding as quickly to SLP communication, frequently staring off.  Pt stated she wants to know the Lord causing her not to respond.   SLP informed the nurse and requested a chaplain consult per pt request.    SLP noted diet in computer as thin- which SLP accidentally placed yesterday.  Saw pt today with thin via straw - which allows her to self feed *hold own cup with straw = sequential swallows followed by cough/belching and pt continues with expiratory wheeze.    Much improved tolerance of nectar thick liquids noted especially in light of poor positioning in bed *not being placed in reverse trendelenburg as requested and completed by SLP.  Advised RN to placed pt in this position to maximize safety with po in a low bed.   Recommend continue diet with strict precautions and allow tsps of thin liquids.  Due to pt's decreased responsiveness today, do not recommend advance diet. SLP will follow up Monday if pt remains in house however if pt leaves back to SNF, please assure SLP follow up conducted to assure pt is on LRD.  Pt educated to recommendations.  Thanks.    HPI HPI: 63yo F w/ a hx of vascular dementia, A. 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  It was reported to EMS the patient had not been voiding.  Facility reported she had not been eating or drinking for 2 days. CT of the head noted acute right subdural hematomas. Also with UTI.       SLP Plan  Continue with current plan of care        Recommendations  Liquids provided via: Straw;Cup Medication Administration: Whole meds with puree(with puree) Supervision: Full supervision/cueing for compensatory strategies(hand over hand assist, have pt help feed herself) Compensations: Minimize environmental distractions;Slow rate;Small sips/bites(check for oral residuals on left) Postural Changes and/or Swallow Maneuvers: Seated upright 90 degrees;Upright 30-60 min after meal                Oral Care Recommendations: Oral care QID Follow up Recommendations: Skilled Nursing facility SLP Visit Diagnosis: Dysphagia, oropharyngeal phase (R13.12) Plan: Continue with current plan of care       GO                [name] 06/07/2018, 11:27 AM  Donavan Burnet, MS [facility] SLP Acute Rehab Services Pager [phone] Office [phone]

## 2018-06-07 NOTE — Progress Notes (Signed)
Initial Nutrition Assessment  DOCUMENTATION CODES:   Not applicable  INTERVENTION:  Provide Ensure Enlive po BID (thickened to appropriate consistency), each supplement provides 350 kcal and 20 grams of protein.  Encourage adequate PO intake.   NUTRITION DIAGNOSIS:   Increased nutrient needs related to chronic illness(CHF) as evidenced by estimated needs.  GOAL:   Patient will meet greater than or equal to 90% of their needs  MONITOR:   PO intake, Supplement acceptance, Labs, Weight trends, I & O's, Skin  REASON FOR ASSESSMENT:   Low Braden    ASSESSMENT:   63 year old relevant for vascular dementia, atrial fibrillation status post pacemaker, chronic diastolic heart failure, hypertension, hyperlipidemia, seizure disorder, history of CVA with residual left-sided weakness and specific spasticity admitted from [facility] for evaluation of hypotension and altered mental status and found to have hypernatremia. Pt with UTI, urine culture grew out relatively sensitive Klebsiella  No family at bedside during time of visit. Pt did not awaken to RD assessment. RD unable to obtain most recent nutrition history. Meal completion has been varied from 0-75%. Pt lethargic at lunch today, however did consume breakfast this AM. RD to order nutritional supplements to aid in caloric and protein needs. Plans to discharge and return back to [facility]. Recommend providing nutritional supplements post discharge especially if po intake becomes poor to aid in adequate nutrition. Labs and medications reviewed.   NUTRITION - FOCUSED PHYSICAL EXAM:    Most Recent Value  Orbital Region  Unable to assess  Upper Arm Region  No depletion  Thoracic and Lumbar Region  No depletion  Buccal Region  No depletion  Temple Region  No depletion  Clavicle Bone Region  No depletion  Clavicle and Acromion Bone Region  No depletion  Scapular Bone Region  Unable to assess  Dorsal Hand  No depletion  Patellar  Region  No depletion  Anterior Thigh Region  No depletion  Posterior Calf Region  No depletion  Edema (RD Assessment)  Mild  Hair  Reviewed  Eyes  Unable to assess  Mouth  Reviewed  Skin  Reviewed  Nails  Reviewed       Diet Order:   Diet Order            DIET - DYS 1 Room service appropriate? Yes; Fluid consistency: Nectar Thick  Diet effective now        DIET - DYS 1              EDUCATION NEEDS:   Not appropriate for education at this time  Skin:  Skin Assessment: Reviewed RN Assessment  Last BM:  2/4  Height:   Ht Readings from Last 1 Encounters:  No data found for Ht  Recommend obtaining new height measurement.  Weight:   Wt Readings from Last 1 Encounters:  06/02/18 83.7 kg    Ideal Body Weight:    N/A  BMI:  There is no height or weight on file to calculate BMI.  Estimated Nutritional Needs:   Kcal:  1650-1850  Protein:  85-100 grams  Fluid:  1.6- 1.8 L/day    Roslyn Smiling, MS, RD, LDN Pager # [phone] After hours/ weekend pager # [phone]

## 2018-06-07 NOTE — Plan of Care (Signed)
  Problem: Clinical Measurements: Goal: Ability to maintain clinical measurements within normal limits will improve Outcome: Progressing   Problem: Clinical Measurements: Goal: Will remain free from infection Outcome: Progressing   Problem: Clinical Measurements: Goal: Respiratory complications will improve Outcome: Progressing   Problem: Activity: Goal: Risk for activity intolerance will decrease Outcome: Progressing   

## 2018-06-08 LAB — VITAMIN B1: Vitamin B1 (Thiamine): 99.5 nmol/L (ref 66.5–200.0)

## 2018-06-08 NOTE — Progress Notes (Signed)
Pt d/c to [facility] accompanied by [employer].Ccondition at d/c was stable .Due to pm medication po given.

## 2018-06-09 ENCOUNTER — Encounter (HOSPITAL_COMMUNITY): Payer: Self-pay | Admitting: Internal Medicine

## 2018-06-09 ENCOUNTER — Emergency Department (HOSPITAL_COMMUNITY): Payer: Medicare Other

## 2018-06-09 ENCOUNTER — Inpatient Hospital Stay (HOSPITAL_COMMUNITY)
Admission: EM | Admit: 2018-06-09 | Discharge: 2018-06-17 | DRG: 056 | Disposition: A | Discharge status: Skilled Nursing Facility | Payer: Medicare Other | Attending: Internal Medicine | Admitting: Internal Medicine

## 2018-06-09 ENCOUNTER — Encounter (HOSPITAL_COMMUNITY): Payer: Self-pay | Admitting: *Deleted

## 2018-06-09 ENCOUNTER — Other Ambulatory Visit: Payer: Self-pay

## 2018-06-09 DIAGNOSIS — E875 Hyperkalemia: Secondary | ICD-10-CM | POA: Diagnosis present

## 2018-06-09 DIAGNOSIS — J449 Chronic obstructive pulmonary disease, unspecified: Secondary | ICD-10-CM | POA: Diagnosis present

## 2018-06-09 DIAGNOSIS — Z79899 Other long term (current) drug therapy: Secondary | ICD-10-CM

## 2018-06-09 DIAGNOSIS — I6203 Nontraumatic chronic subdural hemorrhage: Secondary | ICD-10-CM | POA: Diagnosis present

## 2018-06-09 DIAGNOSIS — E785 Hyperlipidemia, unspecified: Secondary | ICD-10-CM | POA: Diagnosis present

## 2018-06-09 DIAGNOSIS — I959 Hypotension, unspecified: Secondary | ICD-10-CM | POA: Diagnosis present

## 2018-06-09 DIAGNOSIS — I82612 Acute embolism and thrombosis of superficial veins of left upper extremity: Secondary | ICD-10-CM | POA: Diagnosis present

## 2018-06-09 DIAGNOSIS — Z87440 Personal history of urinary (tract) infections: Secondary | ICD-10-CM

## 2018-06-09 DIAGNOSIS — I69398 Other sequelae of cerebral infarction: Principal | ICD-10-CM

## 2018-06-09 DIAGNOSIS — F0150 Vascular dementia without behavioral disturbance: Secondary | ICD-10-CM | POA: Diagnosis present

## 2018-06-09 DIAGNOSIS — I5032 Chronic diastolic (congestive) heart failure: Secondary | ICD-10-CM | POA: Diagnosis present

## 2018-06-09 DIAGNOSIS — G9341 Metabolic encephalopathy: Secondary | ICD-10-CM | POA: Diagnosis present

## 2018-06-09 DIAGNOSIS — I2510 Atherosclerotic heart disease of native coronary artery without angina pectoris: Secondary | ICD-10-CM | POA: Diagnosis present

## 2018-06-09 DIAGNOSIS — B964 Proteus (mirabilis) (morganii) as the cause of diseases classified elsewhere: Secondary | ICD-10-CM | POA: Diagnosis present

## 2018-06-09 DIAGNOSIS — Z91040 Latex allergy status: Secondary | ICD-10-CM

## 2018-06-09 DIAGNOSIS — I482 Chronic atrial fibrillation, unspecified: Secondary | ICD-10-CM | POA: Diagnosis present

## 2018-06-09 DIAGNOSIS — I110 Hypertensive heart disease with heart failure: Secondary | ICD-10-CM | POA: Diagnosis present

## 2018-06-09 DIAGNOSIS — I69354 Hemiplegia and hemiparesis following cerebral infarction affecting left non-dominant side: Secondary | ICD-10-CM

## 2018-06-09 DIAGNOSIS — Z7189 Other specified counseling: Secondary | ICD-10-CM

## 2018-06-09 DIAGNOSIS — Z950 Presence of cardiac pacemaker: Secondary | ICD-10-CM | POA: Diagnosis present

## 2018-06-09 DIAGNOSIS — Z823 Family history of stroke: Secondary | ICD-10-CM

## 2018-06-09 DIAGNOSIS — Z880 Allergy status to penicillin: Secondary | ICD-10-CM

## 2018-06-09 DIAGNOSIS — R569 Unspecified convulsions: Secondary | ICD-10-CM

## 2018-06-09 DIAGNOSIS — K219 Gastro-esophageal reflux disease without esophagitis: Secondary | ICD-10-CM | POA: Diagnosis present

## 2018-06-09 DIAGNOSIS — E870 Hyperosmolality and hypernatremia: Secondary | ICD-10-CM

## 2018-06-09 DIAGNOSIS — G4089 Other seizures: Secondary | ICD-10-CM | POA: Diagnosis present

## 2018-06-09 DIAGNOSIS — I82621 Acute embolism and thrombosis of deep veins of right upper extremity: Secondary | ICD-10-CM | POA: Diagnosis present

## 2018-06-09 DIAGNOSIS — Z515 Encounter for palliative care: Secondary | ICD-10-CM

## 2018-06-09 DIAGNOSIS — N390 Urinary tract infection, site not specified: Secondary | ICD-10-CM | POA: Diagnosis present

## 2018-06-09 DIAGNOSIS — R509 Fever, unspecified: Secondary | ICD-10-CM | POA: Diagnosis present

## 2018-06-09 DIAGNOSIS — R4182 Altered mental status, unspecified: Secondary | ICD-10-CM

## 2018-06-09 DIAGNOSIS — R8290 Unspecified abnormal findings in urine: Secondary | ICD-10-CM | POA: Diagnosis present

## 2018-06-09 LAB — URINALYSIS, ROUTINE W REFLEX MICROSCOPIC
Bilirubin Urine: NEGATIVE
Glucose, UA: NEGATIVE mg/dL
Hgb urine dipstick: NEGATIVE
Ketones, ur: NEGATIVE mg/dL
Nitrite: NEGATIVE
PH: 7 (ref 5.0–8.0)
Protein, ur: NEGATIVE mg/dL
Specific Gravity, Urine: 1.011 (ref 1.005–1.030)

## 2018-06-09 LAB — CBC WITH DIFFERENTIAL/PLATELET
ABS IMMATURE GRANULOCYTES: 0.06 10*3/uL (ref 0.00–0.07)
Basophils Absolute: 0 10*3/uL (ref 0.0–0.1)
Basophils Relative: 0 %
Eosinophils Absolute: 0.1 10*3/uL (ref 0.0–0.5)
Eosinophils Relative: 1 %
HCT: 35.6 % — ABNORMAL LOW (ref 36.0–46.0)
Hemoglobin: 10.4 g/dL — ABNORMAL LOW (ref 12.0–15.0)
Immature Granulocytes: 1 %
Lymphocytes Relative: 13 %
Lymphs Abs: 1.2 10*3/uL (ref 0.7–4.0)
MCH: 26.5 pg (ref 26.0–34.0)
MCHC: 29.2 g/dL — ABNORMAL LOW (ref 30.0–36.0)
MCV: 90.8 fL (ref 80.0–100.0)
Monocytes Absolute: 1.1 10*3/uL — ABNORMAL HIGH (ref 0.1–1.0)
Monocytes Relative: 12 %
Neutro Abs: 6.8 10*3/uL (ref 1.7–7.7)
Neutrophils Relative %: 73 %
Platelets: UNDETERMINED 10*3/uL (ref 150–400)
RBC: 3.92 MIL/uL (ref 3.87–5.11)
RDW: 18.2 % — ABNORMAL HIGH (ref 11.5–15.5)
WBC: 9.3 10*3/uL (ref 4.0–10.5)
nRBC: 0 % (ref 0.0–0.2)

## 2018-06-09 LAB — COMPREHENSIVE METABOLIC PANEL
ALT: 25 U/L (ref 0–44)
AST: 65 U/L — ABNORMAL HIGH (ref 15–41)
Albumin: 2.6 g/dL — ABNORMAL LOW (ref 3.5–5.0)
Alkaline Phosphatase: 136 U/L — ABNORMAL HIGH (ref 38–126)
Anion gap: 9 (ref 5–15)
BILIRUBIN TOTAL: 0.9 mg/dL (ref 0.3–1.2)
BUN: <5 mg/dL — ABNORMAL LOW (ref 8–23)
CO2: 23 mmol/L (ref 22–32)
Calcium: 9.1 mg/dL (ref 8.9–10.3)
Chloride: 118 mmol/L — ABNORMAL HIGH (ref 98–111)
Creatinine, Ser: 0.79 mg/dL (ref 0.44–1.00)
GFR calc Af Amer: >60 mL/min (ref >60)
GFR calc non Af Amer: >60 mL/min (ref >60)
Glucose, Bld: 104 mg/dL — ABNORMAL HIGH (ref 70–99)
Potassium: 4 mmol/L (ref 3.5–5.1)
Sodium: 150 mmol/L — ABNORMAL HIGH (ref 135–145)
TOTAL PROTEIN: 8.3 g/dL — AB (ref 6.5–8.1)

## 2018-06-09 LAB — RAPID URINE DRUG SCREEN, HOSP PERFORMED
Amphetamines: NOT DETECTED
Barbiturates: POSITIVE — AB
Benzodiazepines: POSITIVE — AB
Cocaine: NOT DETECTED
Opiates: NOT DETECTED
Tetrahydrocannabinol: NOT DETECTED

## 2018-06-09 LAB — LACTIC ACID, PLASMA
Lactic Acid, Venous: 1.9 mmol/L (ref 0.5–1.9)
Lactic Acid, Venous: 1.9 mmol/L (ref 0.5–1.9)

## 2018-06-09 LAB — PHENOBARBITAL LEVEL: Phenobarbital: 21.1 ug/mL (ref 15.0–30.0)

## 2018-06-09 LAB — BRAIN NATRIURETIC PEPTIDE: B Natriuretic Peptide: 148.5 pg/mL — ABNORMAL HIGH (ref 0.0–100.0)

## 2018-06-09 LAB — TROPONIN I: Troponin I: <0.03 ng/mL (ref <0.03)

## 2018-06-09 LAB — MAGNESIUM: MAGNESIUM: 1.9 mg/dL (ref 1.7–2.4)

## 2018-06-09 MED ORDER — SODIUM CHLORIDE 0.9 % IV BOLUS
500.0000 mL | Freq: Once | INTRAVENOUS | Status: AC
  Administered 2018-06-09: 500 mL via INTRAVENOUS

## 2018-06-09 MED ORDER — LORAZEPAM 2 MG/ML IJ SOLN
1.0000 mg | Freq: Once | INTRAMUSCULAR | Status: AC
  Administered 2018-06-09: 1 mg via INTRAVENOUS
  Filled 2018-06-09: qty 1

## 2018-06-09 MED ORDER — ACETAMINOPHEN 650 MG RE SUPP
650.0000 mg | RECTAL | Status: DC | PRN
  Administered 2018-06-14: 650 mg via RECTAL
  Filled 2018-06-09: qty 1

## 2018-06-09 MED ORDER — ACETAMINOPHEN 650 MG RE SUPP
650.0000 mg | Freq: Once | RECTAL | Status: AC
  Administered 2018-06-09: 650 mg via RECTAL
  Filled 2018-06-09: qty 1

## 2018-06-09 MED ORDER — ACETAMINOPHEN 325 MG PO TABS
650.0000 mg | ORAL_TABLET | ORAL | Status: DC | PRN
  Administered 2018-06-10 – 2018-06-16 (×2): 650 mg via ORAL
  Filled 2018-06-09 (×2): qty 2

## 2018-06-09 MED ORDER — SODIUM CHLORIDE 0.9 % IV SOLN
75.0000 mL/h | INTRAVENOUS | Status: DC
  Administered 2018-06-09 – 2018-06-10 (×3): 75 mL/h via INTRAVENOUS

## 2018-06-09 MED ORDER — LORAZEPAM 2 MG/ML IJ SOLN: 4.0000 mg | INTRAMUSCULAR | Status: AC

## 2018-06-09 MED ORDER — CIPROFLOXACIN IN D5W 400 MG/200ML IV SOLN: 400.0000 mg | Freq: Once | INTRAVENOUS | Status: DC

## 2018-06-09 MED ORDER — SODIUM CHLORIDE 0.9% FLUSH
10.0000 mL | INTRAVENOUS | Status: DC | PRN
  Administered 2018-06-12: 20 mL
  Filled 2018-06-09: qty 40

## 2018-06-09 MED ORDER — SENNA 8.6 MG PO TABS
1.0000 | ORAL_TABLET | Freq: Two times a day (BID) | ORAL | Status: DC
  Administered 2018-06-10 – 2018-06-17 (×13): 8.6 mg via ORAL
  Filled 2018-06-09 (×15): qty 1

## 2018-06-09 MED ORDER — LEVETIRACETAM IN NACL 500 MG/100ML IV SOLN
500.0000 mg | Freq: Once | INTRAVENOUS | Status: AC
  Administered 2018-06-09: 500 mg via INTRAVENOUS
  Filled 2018-06-09: qty 100

## 2018-06-09 MED ORDER — LORAZEPAM 2 MG/ML IJ SOLN
1.0000 mg | INTRAMUSCULAR | Status: DC | PRN
  Administered 2018-06-11 – 2018-06-13 (×2): 2 mg via INTRAVENOUS
  Filled 2018-06-09 (×2): qty 1

## 2018-06-09 MED ORDER — ACETAMINOPHEN 500 MG PO TABS: 1000.0000 mg | ORAL_TABLET | Freq: Once | ORAL | Status: DC

## 2018-06-09 NOTE — H&P (Signed)
History and Physical    Ana Collins MVH:[id number] DOB: 01-03-1956 DOA: 06/09/2018  PCP: Karna DupesBlake, Khashana A, MD  Patient coming from: Skilled nursing facility  I have personally briefly reviewed patient's [facility]  Chief Complaint: Seizure  HPI: Ana Collins is a 63 y.o. female with medical history significant of atrial fibrillation with resultant stroke was on Eliquis until the fall when she had a subdural hematoma and that was discontinued.  Congestive heart failure, hypertension, seizures due to stroke, came in to the emergency department via EMS actively seizing.  Patient had a witnessed focal seizure while at the facility she was discharged from the hospital due to altered mental status and urinary tract infection on February 7.  Her son is at the bedside and provides a history.  Placed in a nursing home back in [place] many years ago.  She was there after her initial stroke approximately 2010 or 2011.  Since then [place] [facility] [place] [facility] [place] and now here.  Her son reports that she had a pac[place] [facility] [place] however we are unable to locate records at either [facility] or [facility].  In November 2019 patient had a subdural hematoma.  While she had seizure problems prior to the subdural since the subdural she has had increased episodes of seizures.  Patient has a pacemaker in place.  ED Course: Seizures treated with IV Keppra, lorazepam, and normal urinalysis treated with Cipro.  Review of Systems: As per HPI otherwise all other systems reviewed and  negative.    Past Medical History:  Diagnosis Date  . Anemia   . Atrial fibrillation (HCC)   . CHF (congestive heart failure) (HCC)    diastolic  . CVD (cardiovascular disease)   . Dementia (HCC)    vascular  . Epilepsy (HCC)   . GERD (gastroesophageal reflux disease)   . Heart disease    chronic ischemic  . Hyperlipidemia   . Hypertension   . Pacemaker   . Traumatic  subdural hemorrhage (HCC)    w/o LOC    No past surgical history on file.  Social History   Social History Narrative   04/10/18 residing at [facility]     reports that she has never smoked. She has never used smokeless tobacco. She reports that she does not drink alcohol or use drugs.  Allergies  Allergen Reactions  . Latex Other (See Comments)    ON MAR  . Penicillins Other (See Comments)    ON MAR    Family History  Problem Relation Age of Onset  . CVA Mother      Prior to Admission medications   Medication Sig Start Date End Date Taking? Authorizing Provider  acetaminophen (TYLENOL) 325 MG tablet Take 650 mg by mouth 2 (two) times daily.     [provider]  B Complex-C (B-COMPLEX WITH VITAMIN C) tablet Take 1 tablet by mouth daily.    [provider]  carboxymethylcellul-glycerin (REFRESH OPTIVE) 0.5-0.9 % ophthalmic solution Place 1 drop into both eyes 4 (four) times daily.    [provider]  cloNIDine (CATAPRES) 0.1 MG tablet Take 0.1 mg by mouth 3 (three) times daily.    [provider]  donepezil (ARICEPT) 10 MG tablet Take 10 mg by mouth at bedtime.    [provider]  Fe Fum-FA-B Cmp-C-Zn-Mg-Mn-Cu (HEMOCYTE-PLUS PO) Take 1 capsule by mouth 2 (two) times daily.    [provider]  furosemide (LASIX) 80 MG tablet Take 60 mg by mouth daily.     [provider]  lacosamide 100 MG TABS Take 1 tablet (100 mg total) by mouth 2 (two) times daily. 06/07/18   Hollice Espy, MD  levETIRAcetam (KEPPRA) 100 MG/ML solution Take 15 mLs by mouth 2 (two) times daily.     [provider]  LORazepam (ATIVAN) 0.5 MG tablet Take 1 tablet (0.5 mg total) by mouth 2 (two) times daily. 06/07/18   Hollice Espy, MD  losartan (COZAAR) 25 MG tablet Take 25 mg by mouth daily.     [provider]  Multiple Vitamin (MULTIVITAMIN WITH MINERALS) TABS tablet Take 1 tablet by mouth daily.    [provider]  Nutritional Supplements (RESOURCE 2.0 PO) Take 1 Can by mouth 3 (three) times daily between meals.    [provider]  omeprazole (PRILOSEC) 20 MG capsule Take 20 mg by mouth daily.    [provider]  ondansetron (ZOFRAN) 8 MG tablet Take by mouth every 8 (eight) hours as needed for nausea or vomiting.    [provider]  PHENobarbital (LUMINAL) 64.8 MG tablet Take 64.8 mg by mouth at bedtime.    [provider]  potassium chloride (K-DUR) 10 MEQ tablet Take 20 mEq by mouth daily.    [provider]  pravastatin (PRAVACHOL) 80 MG tablet Take 80 mg by mouth daily.    [provider]  sertraline (ZOLOFT) 50 MG tablet Take 50 mg by mouth daily.    [provider]  sucralfate (CARAFATE) 1 g tablet Take 1 g by mouth 3 (three) times daily as needed Genella Rife).     [provider]  Umeclidinium-Vilanterol (ANORO ELLIPTA IN) Inhale 1 puff into the lungs daily.    [provider]    Physical Exam:  Constitutional: Chronically ill-appearing unresponsive female in respiratory discomfort on facemask oxygen Vitals:   06/09/18 1428 06/09/18 1430 06/09/18 1445 06/09/18 1500  BP:  98/64 105/63 103/64  Pulse:  76 75 70  Resp:  19  20  Temp: 99.9 F (37.7 C)     TempSrc: Axillary     SpO2:  100% 100% 100%   Eyes:  lids and conjunctivae normal, pupils are equal round and reactive to light ENMT: Mucous membranes are moist. Posterior pharynx clear of any exudate or lesions.Normal dentition.  Neck: normal, supple, no masses, no thyromegaly Respiratory: clear to auscultation bilaterally, no wheezing, no crackles. Normal respiratory effort. No accessory muscle use.  Cardiovascular: Regular rate and rhythm, no murmurs / rubs / gallops. No extremity edema. 2+ pedal pulses. No carotid bruits.  Abdomen: no tenderness, no masses palpated. No hepatosplenomegaly. Bowel sounds positive.  Musculoskeletal: no clubbing /  cyanosis. No joint deformity upper and lower extremities. Good ROM, no contractures. Normal muscle tone.  Skin: no rashes, lesions, ulcers. No induration Neurologic:  Sensation intact deep stimulation, DTR normal.  Psychiatric: Able to participate   Labs on Admission: I have personally reviewed following labs and imaging studies  CBC: Recent Labs  Lab 06/03/18 0705 06/04/18 0746 06/06/18 0520 06/09/18 1320  WBC 4.0 4.0 5.0 9.3  NEUTROABS  --   --   --  6.8  HGB 10.6* 10.7* 11.2* 10.4*  HCT 35.8* 35.6* 34.9* 35.6*  MCV 88.6 86.4 85.3 90.8  PLT 65* 70* 59* PLATELET CLUMPS NOTED ON SMEAR, UNABLE TO ESTIMATE   Basic Metabolic Panel: Recent Labs  Lab 06/03/18 0705 06/04/18 0746 06/05/18 0520  06/06/18 0520 06/07/18 1413 06/09/18 1320  NA 151* 147* 146* 146* 145 150*  K 3.6 3.4* 3.7 4.1 4.1 4.0  CL 119* 117* 118* 116* 117* 118*  CO2 22 21* 24 19* 21* 23  GLUCOSE 108* 109* 139* 89 108* 104*  BUN 10 <5* <5* <5* <5* <5*  CREATININE 1.01* 0.74 0.85 0.88 0.97 0.79  CALCIUM 8.5* 8.8* 8.5* 8.7* 8.6* 9.1  MG 1.8  --   --   --   --   --    GFR: CrCl cannot be calculated (Unknown ideal weight.). Liver Function Tests: Recent Labs  Lab 06/03/18 0705 06/09/18 1320  AST 58* 65*  ALT 21 25  ALKPHOS 99 136*  BILITOT 0.9 0.9  PROT 7.2 8.3*  ALBUMIN 2.4* 2.6*   No results for input(s): LIPASE, AMYLASE in the last 168 hours. Recent Labs  Lab 06/03/18 0705  AMMONIA 39*   Coagulation Profile: No results for input(s): INR, PROTIME in the last 168 hours. Cardiac Enzymes: Recent Labs  Lab 06/09/18 1320  TROPONINI <0.03    Urine analysis:    Component Value Date/Time   COLORURINE YELLOW 06/09/2018 1429   APPEARANCEUR CLOUDY (A) 06/09/2018 1429   LABSPEC 1.011 06/09/2018 1429   PHURINE 7.0 06/09/2018 1429   GLUCOSEU NEGATIVE 06/09/2018 1429   HGBUR NEGATIVE 06/09/2018 1429   BILIRUBINUR NEGATIVE 06/09/2018 1429   KETONESUR NEGATIVE 06/09/2018 1429   PROTEINUR NEGATIVE  06/09/2018 1429   NITRITE NEGATIVE 06/09/2018 1429   LEUKOCYTESUR LARGE (A) 06/09/2018 1429    Radiological Exams on Admission: Dg Chest 2 View  Result Date: 06/09/2018 CLINICAL DATA:  63 year old female EXAM: CHEST - 2 VIEW COMPARISON:  None. FINDINGS: Cardiomediastinal silhouette unchanged in size and contour. Low lung volumes with central vascular congestion. Interlobular septal thickening. Opacities at the lung bases obscuring the hemidiaphragms and blunting the costophrenic sulcus on the lateral view. No pneumothorax. Unchanged cardiac pacing device. IMPRESSION: Acute CHF with small pleural effusions. Electronically Signed   By: Gilmer MorJaime  Wagner D.O.   On: 06/09/2018 14:34   Ct Head Wo Contrast  Result Date: 06/09/2018 CLINICAL DATA:  63 year old female with seizure like activity today. Recent subdural hematoma suspected. EXAM: CT HEAD WITHOUT CONTRAST TECHNIQUE: Contiguous axial images were obtained from the base of the skull through the vertex without intravenous contrast. COMPARISON:  06/02/2018 and earlier. FINDINGS: Brain: Continued evidence of an isodense right lateral extra-axial collection measuring 5 millimeters (series 5, image 37) stable over this series of recent exams. No associated midline shift or intracranial mass effect. Superimposed cystic encephalomalacia in the right temporal and parietal lobes and confluent right greater than left cerebral white matter hypodensity with ex vacuo appearing enlargement of the right lateral ventricle. Stable gray-white matter differentiation throughout the brain. No cortically based acute infarct identified. Vascular: Calcified atherosclerosis at the skull base. No suspicious intracranial vascular hyperdensity. Skull: No acute osseous abnormality identified. Sinuses/Orbits: Mild to moderate mucosal thickening in the right frontal and posterior ethmoids is stable. Generally well pneumatized paranasal sinuses otherwise. Stable right mastoid effusion. Right  tympanic cavity remains clear. Other: Leftward gaze deviation otherwise negative orbits. No acute scalp soft tissue findings. IMPRESSION: 1. Stable non contrast CT appearance of the brain since 06/01/2018. 2. Suspected isodense right lateral subdural hematoma measuring 5 mm. No intracranial mass effect. 3. Cystic appearing encephalomalacia in the right temporal and parietal lobes with confluent right greater than left cerebral white matter hypodensity. Electronically Signed   By: Odessa FlemingH  Hall M.D.   On:  06/09/2018 14:39    EKG: Independently reviewed.  Atrial fibrillation with complete right bundle branch block unchanged from June 01, 2018  Assessment/Plan Principal Problem:   Seizure, late effect of stroke [facility]) Active Problems:   Hypernatremia   Abnormal urinalysis   Fever   Chronic subdural hematoma (HCC)   Atrial fibrillation, chronic   Pacemaker   1.  Seizure as a late effect of stroke: Worsened by subdural hematoma have become very frequent.  Despite increases in Keppra, phenobarbital and addition of Vimpat.  Will consult neurology for further recommendations.  Patient was febrile at the time of admission.  She was recently treated for a Klebsiella UTI and finished a full course of treatment 2 days ago.  Not sure if her abnormal urinalysis reflects a new or persistent infection.  Receive a dose of Cipro in the emergency department.  At this point I will monitor her for further signs and symptoms of infection such as recurrent fever.  I suspect the fever is due to her seizures.  2.  Hypernatremia: We will hydrate patient and monitor his results.  Check BMP in a.m.  3.  Abnormal urinalysis: As above doubt new urinary infection.  Patient with this fever but she did have a seizure.  Will monitor for signs of infection if fever spikes again without seizure will consider treatment for urinary tract infection.  Culture pending.  4.  Fever: As above.  5.  Chronic subdural hematoma: No longer a  candidate for anticoagulation for her atrial fibrillation.  6.  Chronic atrial fibrillation cannot receive anticoagulation but is fairly well controlled and has a pacemaker in place.  7.  Pacemaker: Noted.  8.  Goals of care: I had a long discussion with the patient's son.  I showed him his mother's CT scan.  She has a very highly damaged brain.  Initially with a stroke now with loss of volume due to atrophy from the stroke and then the subdural hematoma.  I think her recurrent seizures are a function of the degree of damage her brain has suffered.  She has not spoken in 2 weeks.  He has had significant decline.  When she initially moved to the [place] area she was able to feed herself.  She has not been able to do that anymore.  I briefly discussed with him ventilators and life support as well as shocking the patient.  Seemed very surprised.  Stated he had not had this frank a discussion with the physician ever.  Consulting palliative medicine for further evaluation and discussion.  We believe the patient would benefit from hospice care.  I did not mention this to the patient's son yet.    DVT prophylaxis: SCDs Code Status: Full code Family Communication: Patient's son and his wife who are present at the bedside. Disposition Plan: To skilled facility Consults called: Dr. Otelia Limes from neurology and palliative care Admission status: Inpatient   Lahoma Crocker MD FACP [facility] Pager [phone]  How to contact the [facility] Attending or Consulting provider 7A - 7P or covering provider during after hours 7P -7A, for this patient?  1. Check the care team in [facility] and look for a) attending/consulting [facility] provider listed and b) the [facility] team listed 2. Log into [email or web address] and use 's universal password to access. If you do not have the password, please contact the hospital operator. 3. Locate the [facility] provider you are looking for under [facility] and page to a number  that  you can be directly reached. 4. If you still have difficulty reaching the provider, please page the [facility] (Director on Call) for the Hospitalists listed on amion for assistance.  If 7PM-7AM, please contact night-coverage [email or web address] Password [facility]  06/09/2018, 4:41 PM

## 2018-06-09 NOTE — ED Provider Notes (Addendum)
[facility] EMERGENCY DEPARTMENT Provider Note   CSN: [id number] Arrival date & time: 06/09/18  1214     History   Chief Complaint Chief Complaint  Patient presents with  . Seizures    HPI Ana Collins is a 63 y.o. female.  63 y.o female with an extensive PMH including CHF, HTN, Epilepsy presents to the ED via EMS actively seizing. According to EMS patient had a witnessed focal seizure while at the facility, she was discharged from the hospital due to AMS and UTI. According to son at the bedside patient was reportedly placed in a nursing home back in [place], she then had a subdural, since then she has had an increased seizure activity.  Son at the bedside reports patient was recently in the hospital and discharged 2 days ago, he has not seen her since she was discharged from the hospital and is unable to tell if patient was better after this previous admission.  Patient does have a previous history of A. fib, she was on Coumadin but they report after the small subdural in November she had her Coumadin stopped.  Patient also has a pacemaker in place, family members are not sure what type of pacemaker this might be.  They report patient received most of her care at [facility], I am unable to find any records on her chart.     Past Medical History:  Diagnosis Date  . Anemia   . Atrial fibrillation (HCC)   . CHF (congestive heart failure) (HCC)    diastolic  . CVD (cardiovascular disease)   . Dementia (HCC)    vascular  . Epilepsy (HCC)   . GERD (gastroesophageal reflux disease)   . Heart disease    chronic ischemic  . Hyperlipidemia   . Hypertension   . Pacemaker   . Traumatic subdural hemorrhage (HCC)    w/o LOC    Patient Active Problem List   Diagnosis Date Noted  . Goals of care, counseling/discussion   . Palliative care by specialist   . 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 Abnormal urinalysis 06/09/2018    . Seizure (HCC) 06/09/2018  . Atrial fibrillation, chronic 06/09/2018  . Pacemaker 06/09/2018  . Seizure, late effect of stroke (HCC) 06/09/2018  . Fever 06/09/2018  . Chronic subdural hematoma (HCC) 06/01/2018  . UTI (urinary tract infection) 06/01/2018  . Hypernatremia 06/01/2018  . Acute encephalopathy 06/01/2018  . Hypotension 06/01/2018  . AKI (acute kidney injury) (HCC) 06/01/2018    History reviewed. No pertinent surgical history.   OB History   No obstetric history on file.      Home Medications    Prior to Admission medications   Medication Sig Start Date End Date Taking? Authorizing Provider  acetaminophen (TYLENOL) 325 MG tablet Take 650 mg by mouth 2 (two) times daily.    Yes [provider]  B Complex-C (B-COMPLEX WITH VITAMIN C) tablet Take 1 tablet by mouth daily.   Yes [provider]  carboxymethylcellul-glycerin (REFRESH OPTIVE) 0.5-0.9 % ophthalmic solution Place 1 drop into both eyes 4 (four) times daily.   Yes [provider]  cloNIDine (CATAPRES) 0.1 MG tablet Take 0.1 mg by mouth 3 (three) times daily.   Yes [provider]  donepezil (ARICEPT) 10 MG tablet Take 10 mg by mouth at bedtime.   Yes [provider]  Fe Fum-FA-B Cmp-C-Zn-Mg-Mn-Cu (HEMOCYTE-PLUS PO) Take 1 capsule by mouth 2 (two) times daily.  Yes [provider]  furosemide (LASIX) 20 MG tablet Take 60 mg by mouth daily.    Yes [provider]  losartan (COZAAR) 25 MG tablet Take 25 mg by mouth daily.    Yes [provider]  Multiple Vitamin (MULTIVITAMIN WITH MINERALS) TABS tablet Take 1 tablet by mouth daily.   Yes [provider]  Nutritional Supplements (RESOURCE 2.0 PO) Take 1 Can by mouth 3 (three) times daily between meals.   Yes [provider]  omeprazole (PRILOSEC) 20 MG capsule Take 20 mg by mouth daily.   Yes [provider]  ondansetron (ZOFRAN) 8 MG tablet Take by mouth every 8  (eight) hours as needed for nausea or vomiting.   Yes [provider]  PHENobarbital (LUMINAL) 64.8 MG tablet Take 64.8 mg by mouth at bedtime.   Yes [provider]  potassium chloride (K-DUR) 10 MEQ tablet Take 20 mEq by mouth daily.   Yes [provider]  pravastatin (PRAVACHOL) 80 MG tablet Take 80 mg by mouth daily.   Yes [provider]  sertraline (ZOLOFT) 50 MG tablet Take 50 mg by mouth daily.   Yes [provider]  Umeclidinium-Vilanterol (ANORO ELLIPTA IN) Inhale 1 puff into the lungs daily.   Yes [provider]  Lacosamide 100 MG TABS Take 2 tablets (200 mg total) by mouth 2 (two) times daily. 06/17/18   Marinda Elk, MD  LORazepam (ATIVAN) 0.5 MG tablet Take 1 tablet (0.5 mg total) by mouth 2 (two) times daily. 06/17/18   Marinda Elk, MD    Family History Family History  Problem Relation Age of Onset  . CVA Mother     Social History Social History   Tobacco Use  . Smoking status: Never Smoker  . Smokeless tobacco: Never Used  Substance Use Topics  . Alcohol use: Never    Frequency: Never  . Drug use: Never     Allergies   Latex and Penicillins   Review of Systems Review of Systems  Unable to perform ROS: Acuity of condition     Physical Exam Updated Vital Signs BP (!) 162/94 (BP Location: Left Leg)   Pulse (!) 59   Temp 97.9 F (36.6 C) (Oral)   Resp 20   Ht 5\' 5"  (1.651 m)   Wt 84 kg   SpO2 100%   BMI 30.82 kg/m   Physical Exam Vitals signs and nursing note reviewed.  Constitutional:      Appearance: She is obese. She is ill-appearing.  HENT:     Head: Normocephalic.     Mouth/Throat:     Mouth: Mucous membranes are dry.     Pharynx: Posterior oropharyngeal erythema present.     Comments: Unable to visualize the oropharynx, patient has bitten tongue multiple times during seizure.Actively bleeding.  Eyes:     General: No scleral icterus.    Comments: Pupils fixated on  left gaze, non reactive to light.   Neck:     Musculoskeletal: No neck rigidity or muscular tenderness.  Cardiovascular:     Rate and Rhythm: Normal rate.     Heart sounds: No murmur.  Pulmonary:     Breath sounds: No wheezing or rhonchi.  Abdominal:     General: There is no distension.     Tenderness: There is no abdominal tenderness.  Skin:    General: Skin is dry.     Findings: Bruising present.  Neurological:     Mental Status: She is  disoriented.     Motor: Weakness present.      ED Treatments / Results  Labs (all labs ordered are listed, but only abnormal results are displayed) Labs Reviewed  URINE CULTURE - Abnormal; Notable for the following components:      Result Value   Culture >=100,000 COLONIES/mL PROTEUS MIRABILIS (*)    Organism ID, Bacteria PROTEUS MIRABILIS (*)    All other components within normal limits  CBC WITH DIFFERENTIAL/PLATELET - Abnormal; Notable for the following components:   Hemoglobin 10.4 (*)    HCT 35.6 (*)    MCHC 29.2 (*)    RDW 18.2 (*)    Monocytes Absolute 1.1 (*)    All other components within normal limits  COMPREHENSIVE METABOLIC PANEL - Abnormal; Notable for the following components:   Sodium 150 (*)    Chloride 118 (*)    Glucose, Bld 104 (*)    BUN <5 (*)    Total Protein 8.3 (*)    Albumin 2.6 (*)    AST 65 (*)    Alkaline Phosphatase 136 (*)    All other components within normal limits  URINALYSIS, ROUTINE W REFLEX MICROSCOPIC - Abnormal; Notable for the following components:   APPearance CLOUDY (*)    Leukocytes, UA LARGE (*)    Bacteria, UA MANY (*)    Non Squamous Epithelial 0-5 (*)    All other components within normal limits  BRAIN NATRIURETIC PEPTIDE - Abnormal; Notable for the following components:   B Natriuretic Peptide 148.5 (*)    All other components within normal limits  RAPID URINE DRUG SCREEN, HOSP PERFORMED - Abnormal; Notable for the following components:   Benzodiazepines POSITIVE (*)     Barbiturates POSITIVE (*)    All other components within normal limits  BASIC METABOLIC PANEL - Abnormal; Notable for the following components:   Sodium 156 (*)    Chloride 125 (*)    Glucose, Bld 111 (*)    BUN 5 (*)    Calcium 8.3 (*)    All other components within normal limits  CBC WITH DIFFERENTIAL/PLATELET - Abnormal; Notable for the following components:   RBC 3.81 (*)    Hemoglobin 10.1 (*)    HCT 34.3 (*)    MCHC 29.4 (*)    RDW 18.4 (*)    Platelets 103 (*)    All other components within normal limits  BASIC METABOLIC PANEL - Abnormal; Notable for the following components:   Sodium 155 (*)    Potassium 5.6 (*)    Chloride 129 (*)    CO2 18 (*)    BUN <5 (*)    Calcium 8.4 (*)    All other components within normal limits  BASIC METABOLIC PANEL - Abnormal; Notable for the following components:   Sodium 154 (*)    Chloride 124 (*)    CO2 21 (*)    Glucose, Bld 115 (*)    BUN <5 (*)    Calcium 8.7 (*)    All other components within normal limits  BASIC METABOLIC PANEL - Abnormal; Notable for the following components:   Sodium 154 (*)    Chloride 125 (*)    Glucose, Bld 115 (*)    BUN <5 (*)    Calcium 8.8 (*)    Anion gap 3 (*)    All other components within normal limits  AMMONIA - Abnormal; Notable for the following components:   Ammonia 51 (*)    All other components within  normal limits  BASIC METABOLIC PANEL - Abnormal; Notable for the following components:   Sodium 154 (*)    Chloride 126 (*)    BUN <5 (*)    Calcium 8.7 (*)    Anion gap 4 (*)    All other components within normal limits  CULTURE, BLOOD (ROUTINE X 2)  CULTURE, BLOOD (ROUTINE X 2)  LACTIC ACID, PLASMA  LACTIC ACID, PLASMA  TROPONIN I  MAGNESIUM  PHENOBARBITAL LEVEL  PHENOBARBITAL LEVEL  GLUCOSE, CAPILLARY    EKG EKG Interpretation  Date/Time:  Sunday June 09 2018 12:13:53 EST Ventricular Rate:  93 PR Interval:    QRS Duration: 117 QT Interval:  368 QTC  Calculation: 458 R Axis:   -9 Text Interpretation:  Atrial fibrillation Incomplete right bundle branch block new Repol abnrm suggests ischemia, diffuse leads Confirmed by Gwyneth SproutPlunkett, Whitney ([id number]) on 06/09/2018 12:36:22 PM Also confirmed by Gwyneth SproutPlunkett, Whitney ([id number]), editor Sheppard EvensSimpson, Miranda [phone]  on 06/10/2018 1:09:07 PM   Radiology No results found.  Procedures .Critical Care Performed by: Claude MangesSoto, Chelsy Parrales, PA-C Authorized by: Claude MangesSoto, Anitra Doxtater, PA-C   Critical care provider statement:    Critical care time (minutes):  45   Critical care start time:  06/09/2018 12:00 PM   Critical care end time:  07/08/2018 12:45 PM   Critical care time was exclusive of:  Separately billable procedures and treating other patients   Critical care was necessary to treat or prevent imminent or life-threatening deterioration of the following conditions:  CNS failure or compromise   Critical care was time spent personally by me on the following activities:  Blood draw for specimens, development of treatment plan with patient or surrogate, discussions with consultants, evaluation of patient's response to treatment, examination of patient, obtaining history from patient or surrogate, ordering and performing treatments and interventions, ordering and review of laboratory studies, ordering and review of radiographic studies, pulse oximetry, re-evaluation of patient's condition and review of old charts   (including critical care time)  Medications Ordered in ED Medications  LORazepam (ATIVAN) injection 4 mg (0 mg Intravenous Duplicate 06/09/18 2005)  levETIRAcetam (KEPPRA) IVPB 500 mg/100 mL premix (0 mg Intravenous Stopped 06/09/18 1413)  acetaminophen (TYLENOL) suppository 650 mg (650 mg Rectal Given 06/09/18 1332)  LORazepam (ATIVAN) injection 1 mg (1 mg Intravenous Given 06/09/18 1332)  sodium chloride 0.9 % bolus 500 mL (0 mLs Intravenous Stopped 06/09/18 1507)  lacosamide (VIMPAT) 100 mg in sodium chloride 0.9 % 25 mL IVPB (100  mg Intravenous New Bag/Given 06/11/18 1503)     Initial Impression / Assessment and Plan / ED Course  I have reviewed the triage vital signs and the nursing notes.  Pertinent labs & imaging results that were available during my care of the patient were reviewed by me and considered in my medical decision making (see chart for details).     Patient with a previous history of seizures, CVA, subdural presents to the ED after 2 days of discharge from the hospital due to UTI and altered mental status.  Patient has been actively seizing at the facility, family members at the bedside are able to provide some history stating that she recently had her Keppra adjusted back in December but the nursing home has still been given her the past dosage.  She did have a subdural in November and her health has continued to decrease since.  Today she had 2 witnessed episodes of seizing via EMS and one on arrival, Ativan was giving along with a loading  dose of Keppra.  CT head was ordered to rule out any worsening subdural, no acute changes at this time.  CMP showed elevated sodium at 150, believe this is due to patient not drinking at skilled facility. CBC showed decrease in hemoglobin but consistent with previous visits. Urine remains large leukocytes and many bacteria, will start IV antibiotics.  Patient does have a previous history of A. fib, was on Coumadin at a point but after the subdural the Coumadin was discontinued, according to family member she also has a clot to her left arm, unsure of the course seeing as she is no longer on a blood thinner. Blood cultures x 2. Will obtain BNP, Chest xray showed: Acute CHF with small pleural effusions.  Will place hospitalist to admit. Dr Art BuffShehan to admit patient, is requesting Neurology consult, will place call to Dr. Otelia LimesLindzen for his recommendations.   4:00 PM Spoke to Dr. Otelia LimesLindzen who will evaluate patient.   Final Clinical Impressions(s) / ED Diagnoses   Final  diagnoses:  Seizure-like activity (HCC)  Altered mental status, unspecified altered mental status type  Fever, unspecified  Hypernatremia    ED Discharge Orders         Ordered    LORazepam (ATIVAN) 0.5 MG tablet  2 times daily     06/17/18 0938    Increase activity slowly     06/17/18 0938    Diet - low sodium heart healthy     06/17/18 0938    Lacosamide 100 MG TABS  2 times daily     06/17/18 0941           Claude MangesSoto, Tagen Brethauer, PA-C 06/09/18 1600    Claude MangesSoto, Nethra Mehlberg, PA-C 06/09/18 1615    Gwyneth SproutPlunkett, Whitney, MD 06/11/18 1555    Claude MangesSoto, Evagelia Knack, PA-C 07/09/18 1648    Claude MangesSoto, Heaton Sarin, PA-C 07/09/18 1649    Gwyneth SproutPlunkett, Whitney, MD 07/12/18 [phone]

## 2018-06-09 NOTE — Consult Note (Addendum)
NEURO HOSPITALIST CONSULT NOTE   Requesting physician: Dr. Willette Pa  Reason for Consult: Recurrent seizure versus AMS at SNF  History obtained from:  Family and Chart     HPI:                                                                                                                                          Ana Collins is an 63 y.o. female who was discharged from the hospital to SNF yesterday after management of hypotension, AMS, hypernatremia and seizure. She has a history of traumatic subdural hemorrhage, vascular dementia, atrial fibrillation status post pacemaker, chronic diastolic heart failure, hypertension, hyperlipidemia, seizure disorder (on Keppra and phenobarbital) and history of CVA with residual left-sided weakness and increased left sided tone. Neurology had seen the patient in consultation during the last admission for breakthrough seizure in the setting of UTI and subdural hematoma. Vimpat had been started and Keppra/phenobarbital had been continued. Her cognition had improved by 2/6 after being started on high-dose thiamine.   Problem list last admission included UTI (last dose of Bactrim was on 2/6), hypernatremia (Na down to 145 on day of discharge), stable subdural hematomas (ASA had been held and Neurosurgery felt that surgery was not indicated), dysphagia (dysphagia 1 diet had been prescribed), seizure disorder (see above), history of stroke (evidence of strokes of the temporal lobe and brainstem - ASA had to be held due to the subdural and plan had been to restart ASA in 2 weeks), atrial fibrillation, COPD, chronic diastolic heart failure, pain and dementia (on donepezil). EEG on 2/2 had shown slowing consistent.   CT head in the ED today was stable, with suspected isodense right lateral subdural hematoma measuring 5 mm, no intracranial mass effect and cystic appearing encephalomalacia in the right temporal and parietal lobes with confluent right greater  than left cerebral white matter hypodensity.   Past Medical History:  Diagnosis Date  . Anemia   . Atrial fibrillation (HCC)   . CHF (congestive heart failure) (HCC)    diastolic  . CVD (cardiovascular disease)   . Dementia (HCC)    vascular  . Epilepsy (HCC)   . GERD (gastroesophageal reflux disease)   . Heart disease    chronic ischemic  . Hyperlipidemia   . Hypertension   . Pacemaker   . Traumatic subdural hemorrhage (HCC)    w/o LOC    No past surgical history on file.  Family History  Problem Relation Age of Onset  . CVA Mother               Social History:  reports that she has never smoked. She has never used smokeless tobacco. She reports that she does not drink alcohol or use drugs.  Allergies  Allergen Reactions  .  Latex Other (See Comments)    ON MAR  . Penicillins Other (See Comments)    ON MAR    HOME MEDICATIONS:                                                                                                                        ROS:                                                                                                                                       Unable to obtain due to AMS.    Blood pressure 104/69, pulse 69, temperature 99.9 F (37.7 C), temperature source Axillary, resp. rate 16, SpO2 100 %.   General Examination:                                                                                                       Physical Exam  HEENT-  [place]/AT. Proptotic bilaterally (unchanged since last admission) Lungs- Respirations unlabored on NRB mask Ext: Warm and well perfused  Neurological Examination Mental Status: Obtunded. Moans to noxious stimuli. Does not open eyes except with sustained sternal rub; does not attend to visual stimuli. Moves extremities semipurposefully only. Does not follow commands or attend to name calling. Nonverbal with no attempts to communicate.  Cranial Nerves:  II: PERRL. No blink to threat with  eyelids held open III,IV, VI: Exotropia. No nystagmus. V,VII: smile symmetric, facial light touch sensation normal bilaterally VIII: No responses to questions.  IX,X: Unable to assess XI: Head at midline XII: Does not open mouth or protrude tongue to command Motor/Sensory: Moves RUE and RLE to noxious > LUE and LLE Deep Tendon Reflexes: 2+ and symmetric throughout Cerebellar/Gait: Unable to assess   Lab Results: Basic Metabolic Panel: Recent Labs  Lab 06/03/18 0705 06/04/18 0746 06/05/18 0520 06/06/18 0520 06/07/18 1413 06/09/18 1320  NA 151* 147* 146* 146* 145 150*  K 3.6 3.4* 3.7 4.1 4.1 4.0  CL 119* 117* 118* 116*  117* 118*  CO2 22 21* 24 19* 21* 23  GLUCOSE 108* 109* 139* 89 108* 104*  BUN 10 <5* <5* <5* <5* <5*  CREATININE 1.01* 0.74 0.85 0.88 0.97 0.79  CALCIUM 8.5* 8.8* 8.5* 8.7* 8.6* 9.1  MG 1.8  --   --   --   --   --     CBC: Recent Labs  Lab 06/03/18 0705 06/04/18 0746 06/06/18 0520 06/09/18 1320  WBC 4.0 4.0 5.0 9.3  NEUTROABS  --   --   --  6.8  HGB 10.6* 10.7* 11.2* 10.4*  HCT 35.8* 35.6* 34.9* 35.6*  MCV 88.6 86.4 85.3 90.8  PLT 65* 70* 59* PLATELET CLUMPS NOTED ON SMEAR, UNABLE TO ESTIMATE    Cardiac Enzymes: Recent Labs  Lab 06/09/18 1320  TROPONINI <0.03    Lipid Panel: No results for input(s): CHOL, TRIG, HDL, CHOLHDL, VLDL, LDLCALC in the last 168 hours.  Imaging: Dg Chest 2 View  Result Date: 06/09/2018 CLINICAL DATA:  63 year old female EXAM: CHEST - 2 VIEW COMPARISON:  None. FINDINGS: Cardiomediastinal silhouette unchanged in size and contour. Low lung volumes with central vascular congestion. Interlobular septal thickening. Opacities at the lung bases obscuring the hemidiaphragms and blunting the costophrenic sulcus on the lateral view. No pneumothorax. Unchanged cardiac pacing device. IMPRESSION: Acute CHF with small pleural effusions. Electronically Signed   By: Gilmer Mor D.O.   On: 06/09/2018 14:34   Ct Head Wo  Contrast  Result Date: 06/09/2018 CLINICAL DATA:  63 year old female with seizure like activity today. Recent subdural hematoma suspected. EXAM: CT HEAD WITHOUT CONTRAST TECHNIQUE: Contiguous axial images were obtained from the base of the skull through the vertex without intravenous contrast. COMPARISON:  06/02/2018 and earlier. FINDINGS: Brain: Continued evidence of an isodense right lateral extra-axial collection measuring 5 millimeters (series 5, image 37) stable over this series of recent exams. No associated midline shift or intracranial mass effect. Superimposed cystic encephalomalacia in the right temporal and parietal lobes and confluent right greater than left cerebral white matter hypodensity with ex vacuo appearing enlargement of the right lateral ventricle. Stable gray-white matter differentiation throughout the brain. No cortically based acute infarct identified. Vascular: Calcified atherosclerosis at the skull base. No suspicious intracranial vascular hyperdensity. Skull: No acute osseous abnormality identified. Sinuses/Orbits: Mild to moderate mucosal thickening in the right frontal and posterior ethmoids is stable. Generally well pneumatized paranasal sinuses otherwise. Stable right mastoid effusion. Right tympanic cavity remains clear. Other: Leftward gaze deviation otherwise negative orbits. No acute scalp soft tissue findings. IMPRESSION: 1. Stable non contrast CT appearance of the brain since 06/01/2018. 2. Suspected isodense right lateral subdural hematoma measuring 5 mm. No intracranial mass effect. 3. Cystic appearing encephalomalacia in the right temporal and parietal lobes with confluent right greater than left cerebral white matter hypodensity. Electronically Signed   By: Odessa Fleming M.D.   On: 06/09/2018 14:39    Assessment: 63 year old female with gradual worsening since UTI onset 2 weeks ago. Re-presents with breakthrough seizure 1. Was on Vimpat, Keppra and phenobarbital at the SNF.  Vimpat had been added to her regimen last admission.  2. Breakthrough seizure may have been triggered by fever, or vice versa 3. History of traumatic subdural hemorrhage, vascular dementia, atrial fibrillation status post pacemaker, chronic diastolic heart failure, hypertension, hyperlipidemia and history of CVA with residual left-sided weakness and increased left sided tone.  Recommendations: 1. Continue her anticonvulsants at current doses for now 2. EEG in the morning.  3. Hospitalist  service monitoring for possible recrudescence of infection. 4. Hospitalist service has shown CT images to family and discussed long-term prognosis with family in the context of the patient's old strokes and multiple medical comorbidities. A palliative care consult is being considered.  5. Off antiplatelet medication due to subdural hematoma. SCDs for DVT prophylaxis.   Electronically signed: Dr. Caryl Pina 06/09/2018, 5:06 PM

## 2018-06-09 NOTE — ED Triage Notes (Signed)
Pt arrives via EMS from [facility] with reports of seizure like activity lasting 7 minutes. Pt with hx epilepsy. Given 5mg  midazolam en route. Upon arrival to the ED pt with focal seizure activity. EMS gave another 5mg  midazolam. 132/78, HR 88, RR 24, 91% RA and 97% 8L simple mask, CBG 146, 102F. GCS 9 with EMS. PA to the bedside to assess pt. 22 L wrist.

## 2018-06-09 NOTE — ED Notes (Signed)
Patient transported to CT 

## 2018-06-09 NOTE — ED Notes (Signed)
Pt returned from imaging.

## 2018-06-10 DIAGNOSIS — J449 Chronic obstructive pulmonary disease, unspecified: Secondary | ICD-10-CM | POA: Diagnosis present

## 2018-06-10 DIAGNOSIS — I110 Hypertensive heart disease with heart failure: Secondary | ICD-10-CM | POA: Diagnosis present

## 2018-06-10 DIAGNOSIS — I6203 Nontraumatic chronic subdural hemorrhage: Secondary | ICD-10-CM | POA: Diagnosis not present

## 2018-06-10 DIAGNOSIS — R5081 Fever presenting with conditions classified elsewhere: Secondary | ICD-10-CM

## 2018-06-10 DIAGNOSIS — B964 Proteus (mirabilis) (morganii) as the cause of diseases classified elsewhere: Secondary | ICD-10-CM | POA: Diagnosis present

## 2018-06-10 DIAGNOSIS — E875 Hyperkalemia: Secondary | ICD-10-CM | POA: Diagnosis present

## 2018-06-10 DIAGNOSIS — F0150 Vascular dementia without behavioral disturbance: Secondary | ICD-10-CM | POA: Diagnosis present

## 2018-06-10 DIAGNOSIS — E785 Hyperlipidemia, unspecified: Secondary | ICD-10-CM | POA: Diagnosis present

## 2018-06-10 DIAGNOSIS — M7989 Other specified soft tissue disorders: Secondary | ICD-10-CM | POA: Diagnosis not present

## 2018-06-10 DIAGNOSIS — Z950 Presence of cardiac pacemaker: Secondary | ICD-10-CM

## 2018-06-10 DIAGNOSIS — I482 Chronic atrial fibrillation, unspecified: Secondary | ICD-10-CM

## 2018-06-10 DIAGNOSIS — Z880 Allergy status to penicillin: Secondary | ICD-10-CM | POA: Diagnosis not present

## 2018-06-10 DIAGNOSIS — I69398 Other sequelae of cerebral infarction: Secondary | ICD-10-CM

## 2018-06-10 DIAGNOSIS — I69354 Hemiplegia and hemiparesis following cerebral infarction affecting left non-dominant side: Secondary | ICD-10-CM | POA: Diagnosis not present

## 2018-06-10 DIAGNOSIS — G4089 Other seizures: Secondary | ICD-10-CM | POA: Diagnosis not present

## 2018-06-10 DIAGNOSIS — E870 Hyperosmolality and hypernatremia: Secondary | ICD-10-CM

## 2018-06-10 DIAGNOSIS — N390 Urinary tract infection, site not specified: Secondary | ICD-10-CM | POA: Diagnosis not present

## 2018-06-10 DIAGNOSIS — I2510 Atherosclerotic heart disease of native coronary artery without angina pectoris: Secondary | ICD-10-CM | POA: Diagnosis present

## 2018-06-10 DIAGNOSIS — Z823 Family history of stroke: Secondary | ICD-10-CM | POA: Diagnosis not present

## 2018-06-10 DIAGNOSIS — R569 Unspecified convulsions: Secondary | ICD-10-CM | POA: Diagnosis present

## 2018-06-10 DIAGNOSIS — R4182 Altered mental status, unspecified: Secondary | ICD-10-CM | POA: Diagnosis not present

## 2018-06-10 DIAGNOSIS — Z7189 Other specified counseling: Secondary | ICD-10-CM | POA: Diagnosis not present

## 2018-06-10 DIAGNOSIS — I5032 Chronic diastolic (congestive) heart failure: Secondary | ICD-10-CM | POA: Diagnosis not present

## 2018-06-10 DIAGNOSIS — I82612 Acute embolism and thrombosis of superficial veins of left upper extremity: Secondary | ICD-10-CM | POA: Diagnosis not present

## 2018-06-10 DIAGNOSIS — R8290 Unspecified abnormal findings in urine: Secondary | ICD-10-CM | POA: Diagnosis not present

## 2018-06-10 DIAGNOSIS — G9341 Metabolic encephalopathy: Secondary | ICD-10-CM | POA: Diagnosis not present

## 2018-06-10 DIAGNOSIS — Z515 Encounter for palliative care: Secondary | ICD-10-CM | POA: Diagnosis not present

## 2018-06-10 DIAGNOSIS — Z87440 Personal history of urinary (tract) infections: Secondary | ICD-10-CM | POA: Diagnosis not present

## 2018-06-10 DIAGNOSIS — I959 Hypotension, unspecified: Secondary | ICD-10-CM | POA: Diagnosis not present

## 2018-06-10 DIAGNOSIS — I82621 Acute embolism and thrombosis of deep veins of right upper extremity: Secondary | ICD-10-CM | POA: Diagnosis not present

## 2018-06-10 DIAGNOSIS — K219 Gastro-esophageal reflux disease without esophagitis: Secondary | ICD-10-CM | POA: Diagnosis present

## 2018-06-10 LAB — CBC WITH DIFFERENTIAL/PLATELET
Abs Immature Granulocytes: 0.06 10*3/uL (ref 0.00–0.07)
Basophils Absolute: 0 10*3/uL (ref 0.0–0.1)
Basophils Relative: 0 %
Eosinophils Absolute: 0.2 10*3/uL (ref 0.0–0.5)
Eosinophils Relative: 3 %
HCT: 34.3 % — ABNORMAL LOW (ref 36.0–46.0)
Hemoglobin: 10.1 g/dL — ABNORMAL LOW (ref 12.0–15.0)
Immature Granulocytes: 1 %
Lymphocytes Relative: 10 %
Lymphs Abs: 0.9 10*3/uL (ref 0.7–4.0)
MCH: 26.5 pg (ref 26.0–34.0)
MCHC: 29.4 g/dL — AB (ref 30.0–36.0)
MCV: 90 fL (ref 80.0–100.0)
Monocytes Absolute: 0.9 10*3/uL (ref 0.1–1.0)
Monocytes Relative: 10 %
Neutro Abs: 6.7 10*3/uL (ref 1.7–7.7)
Neutrophils Relative %: 76 %
Platelets: 103 10*3/uL — ABNORMAL LOW (ref 150–400)
RBC: 3.81 MIL/uL — ABNORMAL LOW (ref 3.87–5.11)
RDW: 18.4 % — ABNORMAL HIGH (ref 11.5–15.5)
WBC: 8.8 10*3/uL (ref 4.0–10.5)
nRBC: 0.2 % (ref 0.0–0.2)

## 2018-06-10 LAB — BASIC METABOLIC PANEL
Anion gap: 7 (ref 5–15)
BUN: 5 mg/dL — ABNORMAL LOW (ref 8–23)
CO2: 24 mmol/L (ref 22–32)
Calcium: 8.3 mg/dL — ABNORMAL LOW (ref 8.9–10.3)
Chloride: 125 mmol/L — ABNORMAL HIGH (ref 98–111)
Creatinine, Ser: 0.82 mg/dL (ref 0.44–1.00)
GFR calc Af Amer: >60 mL/min (ref >60)
GFR calc non Af Amer: >60 mL/min (ref >60)
Glucose, Bld: 111 mg/dL — ABNORMAL HIGH (ref 70–99)
Potassium: 3.5 mmol/L (ref 3.5–5.1)
SODIUM: 156 mmol/L — AB (ref 135–145)

## 2018-06-10 MED ORDER — DONEPEZIL HCL 10 MG PO TABS
10.0000 mg | ORAL_TABLET | Freq: Every day | ORAL | Status: DC
  Administered 2018-06-10 – 2018-06-16 (×7): 10 mg via ORAL
  Filled 2018-06-10 (×7): qty 1

## 2018-06-10 MED ORDER — PHENOBARBITAL 32.4 MG PO TABS
64.8000 mg | ORAL_TABLET | Freq: Every day | ORAL | Status: DC
  Administered 2018-06-10 – 2018-06-13 (×4): 64.8 mg via ORAL
  Filled 2018-06-10 (×4): qty 2

## 2018-06-10 MED ORDER — LORAZEPAM 0.5 MG PO TABS
0.5000 mg | ORAL_TABLET | Freq: Two times a day (BID) | ORAL | Status: DC
  Administered 2018-06-10 – 2018-06-12 (×5): 0.5 mg via ORAL
  Filled 2018-06-10 (×5): qty 1

## 2018-06-10 MED ORDER — LACOSAMIDE 50 MG PO TABS
100.0000 mg | ORAL_TABLET | Freq: Two times a day (BID) | ORAL | Status: DC
  Administered 2018-06-10 – 2018-06-11 (×3): 100 mg via ORAL
  Filled 2018-06-10 (×3): qty 2

## 2018-06-10 MED ORDER — LEVETIRACETAM 100 MG/ML PO SOLN
1500.0000 mg | Freq: Two times a day (BID) | ORAL | Status: DC
  Administered 2018-06-10 – 2018-06-13 (×7): 1500 mg via ORAL
  Filled 2018-06-10 (×7): qty 15

## 2018-06-10 MED ORDER — SODIUM CHLORIDE 0.9 % IV SOLN
1.0000 g | INTRAVENOUS | Status: DC
  Administered 2018-06-10 – 2018-06-11 (×2): 1 g via INTRAVENOUS
  Filled 2018-06-10 (×4): qty 10

## 2018-06-10 NOTE — Progress Notes (Addendum)
NEUROLOGY PROGRESS NOTE  Subjective: Patient currently in bed.  Awakens to stimuli.  Moaning.  Any attempt to move extremities causes pain.  The only vocality I could get was when moving extremities she stated "that hurts ".  I did get to meet with the daughter later in the day.  Patient's daughter states that while in the nursing home they increased her Keppra from 1000 mg a day to 1500 mg a day.  She noted a significant decrease in her mentation and significant increase in sedation.  Patient is still awaiting EEG.   Exam: Vitals:   06/10/18 0314 06/10/18 0739  BP: 116/76 (!) 105/42  Pulse: 68 79  Resp: 19 14  Temp: 98.8 F (37.1 C) 98.3 F (36.8 C)  SpO2: 96% 100%    Physical Exam   HEENT-  Normocephalic, no lesions, without obvious abnormality.  Normal external eye and conjunctiva.  Significantly swollen lower lip Extremities- Warm, dry and intact Musculoskeletal-no joint tenderness, deformity or swelling Skin-warm and dry, no hyperpigmentation, vitiligo, or suspicious lesions    Neuro:  Mental Status: Patient is alert.  As noted above I could only get her to speak once with noxious stimuli by lifting extremities in which she stated "that hurts".  Otherwise patient moaning.  Patient does not follow any commands. Cranial Nerves: II: Blinks to threat bilaterally III,IV, VI: Disconjugate with a left eye exotropia and proptosis V,VII: smile symmetric, facial light touch sensation normal bilaterally VIII: No response to questions IX,X: Able to assess XI: bilateral shoulder shrug XII: Empty to open mouth however patient held mouth clenched shut Motor: Difficult to assess as patient states that she is in pain with any movements.  Hard to detect if she is moving the right greater than left again secondary to pain. Sensory: As noted any passive range of motion causes discomfort. Deep Tendon Reflexes: 2+ and symmetric throughout upper extremities    Medications:  Scheduled: .  senna  1 tablet Oral BID   Continuous: . sodium chloride 75 mL/hr (06/09/18 1800)    Pertinent Labs/Diagnostics:   Dg Chest 2 View  Result Date: 06/09/2018 CLINICAL DATA:  63 year old female EXAM: CHEST - 2 VIEW COMPARISON:  None. FINDINGS: Cardiomediastinal silhouette unchanged in size and contour. Low lung volumes with central vascular congestion. Interlobular septal thickening. Opacities at the lung bases obscuring the hemidiaphragms and blunting the costophrenic sulcus on the lateral view. No pneumothorax. Unchanged cardiac pacing device. IMPRESSION: Acute CHF with small pleural effusions. Electronically Signed   By: Gilmer MorJaime  Wagner D.O.   On: 06/09/2018 14:34   Ct Head Wo Contrast  Result Date: 06/09/2018 CLINICAL DATA:  63 year old female with seizure like activity today. Recent subdural hematoma suspected. EXAM: CT HEAD WITHOUT CONTRAST TECHNIQUE: Contiguous axial images were obtained from the base of the skull through the vertex without intravenous contrast. COMPARISON:  06/02/2018 and earlier. FINDINGS: Brain: Continued evidence of an isodense right lateral extra-axial collection measuring 5 millimeters (series 5, image 37) stable over this series of recent exams. No associated midline shift or intracranial mass effect. Superimposed cystic encephalomalacia in the right temporal and parietal lobes and confluent right greater than left cerebral white matter hypodensity with ex vacuo appearing enlargement of the right lateral ventricle. Stable gray-white matter differentiation throughout the brain. No cortically based acute infarct identified. Vascular: Calcified atherosclerosis at the skull base. No suspicious intracranial vascular hyperdensity. Skull: No acute osseous abnormality identified. Sinuses/Orbits: Mild to moderate mucosal thickening in the right frontal and posterior ethmoids is stable.  Generally well pneumatized paranasal sinuses otherwise. Stable right mastoid effusion. Right tympanic  cavity remains clear. Other: Leftward gaze deviation otherwise negative orbits. No acute scalp soft tissue findings. IMPRESSION: 1. Stable non contrast CT appearance of the brain since 06/01/2018. 2. Suspected isodense right lateral subdural hematoma measuring 5 mm. No intracranial mass effect. 3. Cystic appearing encephalomalacia in the right temporal and parietal lobes with confluent right greater than left cerebral white matter hypodensity. Electronically Signed   By: Odessa FlemingH  Hall M.D.   On: 06/09/2018 14:39     Felicie MornDavid Smith PA-C [facility] [phone]   Assessment: 63 year old female with gradual worsening since UTI onset 2 weeks ago.  Scented to the hospital breakthrough seizure in setting of UTI- urinalysis again shows large amounts of leukocytes and bacteria.  [facility] and is currently on Vimpat, Keppra and phenobarbital.  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  Recommendations: -Continue current AEDs -Continue to treat UTI - EEG     06/10/2018, 9:39 AM   NEUROHOSPITALIST ADDENDUM Performed a face to face diagnostic evaluation.   I have reviewed the contents of history and physical exam as documented by PA/ARNP/Resident and agree with above documentation.  I have discussed and formulated the above plan as documented. Edits to the note have been made as needed.     Georgiana SpinnerSushanth Aroor MD [facility] [phone]   If 7pm to 7am, please call on call as listed on AMION.

## 2018-06-10 NOTE — Evaluation (Signed)
Clinical/Bedside Swallow Evaluation Patient Details  Name: Ana Collins MRN: [id number] Date of Birth: 10/22/1955  Today's Date: 06/10/2018 Time: SLP Start Time (ACUTE ONLY): 1015 SLP Stop Time (ACUTE ONLY): 1100 SLP Time Calculation (min) (ACUTE ONLY): 45 min  Past Medical History:  Past Medical History:  Diagnosis Date  . Anemia   . Atrial fibrillation (HCC)   . CHF (congestive heart failure) (HCC)    diastolic  . CVD (cardiovascular disease)   . Dementia (HCC)    vascular  . Epilepsy (HCC)   . GERD (gastroesophageal reflux disease)   . Heart disease    chronic ischemic  . Hyperlipidemia   . Hypertension   . Pacemaker   . Traumatic subdural hemorrhage (HCC)    w/o LOC   Past Surgical History: History reviewed. No pertinent surgical history. HPI:  63 yo female readmitted 06/09/2018 via EMS actively seizing. 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 Also with hx vascular dementia.. CXR = Acute CHF with small pleural effusions. Head CT = stable   Assessment / Plan / Recommendation Clinical Impression  Pt presents in similar fashion to BSE completed 06/01/2018. Primarily cognitively based dysphagia, with continued poor awareness of bolus, prolonged oral transit with oral residue noted. Discoordinated munching pattern noted on solids. No overt s/s aspiration observed on any consistency. Pt's daughter in law reports pt was tolerating puree and finely chopped solids and thin liquids, meds whole with liquid just prior to admit. Given presentation today, will begin puree diet and thin liquids. Safe swallow precautions posted at [facility] and reviewed with family and RN. MD and RN informed of results, recommendations, and plan of care.    SLP Visit Diagnosis: Dysphagia, oropharyngeal phase (R13.12)(per previous evaluations)    Aspiration Risk  Moderate aspiration  risk    Diet Recommendation Dysphagia 1 (Puree);Thin liquid   Medication Administration: Whole meds with liquid Supervision: Staff to assist with self feeding;Full supervision/cueing for compensatory strategies Compensations: Minimize environmental distractions;Slow rate;Small sips/bites Postural Changes: Seated upright at 90 degrees;Remain upright for at least 30 minutes after po intake    Other  Recommendations Oral Care Recommendations: Oral care QID(with suction) Other Recommendations: Have oral suction available   Follow up Recommendations Skilled Nursing facility      Frequency and Duration min 2x/week  2 weeks       Prognosis Prognosis for Safe Diet Advancement: Fair Barriers to Reach Goals: Cognitive deficits      Swallow Study   General Date of Onset: 06/09/18 HPI: 63 yo female readmitted 06/09/2018 via EMS actively seizing. 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 Also with hx vascular dementia.. CXR = Acute CHF with small pleural effusions. Head CT = stable Type of Study: Bedside Swallow Evaluation Previous Swallow Assessment: BSE 06/04/2018 Diet Prior to this Study: NPO Temperature Spikes Noted: No Respiratory Status: Room air History of Recent Intubation: No Behavior/Cognition: Alert;Cooperative;Requires cueing;Doesn't follow directions Oral Cavity Assessment: (minimal blood from seizure) Oral Care Completed by SLP: Yes Oral Cavity - Dentition: Adequate natural dentition Vision: Functional for self-feeding Self-Feeding Abilities: Total assist Patient Positioning: Upright in bed Baseline Vocal Quality: Low vocal intensity Volitional Cough: Cognitively unable to elicit Volitional Swallow: Unable to elicit    Oral/Motor/Sensory Function Overall Oral Motor/Sensory Function:  Generalized oral weakness   Ice Chips Ice chips:  Impaired Presentation: Spoon Oral Phase Impairments: Impaired mastication(munching pattern) Oral Phase Functional Implications: Prolonged oral transit Pharyngeal Phase Impairments: (none)   Thin Liquid Thin Liquid: Within functional limits Presentation: Straw Pharyngeal  Phase Impairments: (none)    Nectar Thick Nectar Thick Liquid: Not tested   Honey Thick Honey Thick Liquid: Not tested   Puree Puree: Impaired Presentation: Spoon Oral Phase Impairments: Impaired mastication;Poor awareness of bolus Oral Phase Functional Implications: Oral residue Pharyngeal Phase Impairments: (none)   Solid     Solid: Impaired Oral Phase Impairments: Reduced labial seal;Reduced lingual movement/coordination;Poor awareness of bolus;Impaired mastication Oral Phase Functional Implications: Prolonged oral transit;Oral residue Pharyngeal Phase Impairments: (none)     Vladimir Lenhoff B. Murvin Natal, [facility], CCC-SLP Speech Language Pathologist [phone]  Leigh Aurora 06/10/2018,11:18 AM

## 2018-06-10 NOTE — Progress Notes (Addendum)
PROGRESS NOTE    Ana Collins  ZOX:[id number] DOB: 1955/10/26 DOA: 06/09/2018 PCP: Karna Dupes, MD   Brief Narrative:  Per admission: Ana Collins is a 64 y.o. female with medical history significant of atrial fibrillation with resultant stroke was on Eliquis until the fall when she had a subdural hematoma and that was discontinued.  Congestive heart failure, hypertension, seizures due to stroke, came in to the emergency department via EMS actively seizing.  Patient had a witnessed focal seizure while at the facility she was discharged from the hospital due to altered mental status and urinary tract infection on February 7.  Her son is at the bedside and provides a history.  Placed in a nursing home back in [place] many years ago.  She was there after her initial stroke approximately 2010 or 2011.  Since then she has moved to the [facility] area and now here.  Her son reports that she had a pacemaker placed in the [facility] area however we are unable to locate records at either [facility] or [facility].  In November 2019 patient had a subdural hematoma.  While she had seizure problems prior to the subdural since the subdural she has had increased episodes of seizures.  Patient has a pacemaker in place.  ED Course: Seizures treated with IV Keppra, lorazepam, and normal urinalysis treated with Cipro.   Assessment & Plan:   Principal Problem:   Seizure, late effect of stroke (HCC) Active Problems:   Chronic subdural hematoma (HCC)   Hypernatremia   Abnormal urinalysis   Atrial fibrillation, chronic   Pacemaker   Fever  ADDENDUM: UCX CAME BACK WITH PROTEUS, ADDED CEFTRIAXONE, WILL F/UP SENSITIVITIES  1.  Seizure as a late effect of stroke: Worsened by subdural hematoma have become very frequent.  Despite increases in Keppra, phenobarbital and addition of Vimpat.    G will continue current antiepileptics, EEG this morning.  As noted by admitting provider, patient was febrile at the  time of admission.  She was recently treated for a Klebsiella UTI and finished a full course of treatment 2 days ago.    Urine cultures been collected, antibiotics were not continued.  We will follow-up urine culture today.  Patient currently afebrile with a normal white count  2.  Hypernatremia: We will hydrate patient and monitor his results.    Ordered BMP follow-up results of sodium  3.  Abnormal urinalysis: As above follow-up culture, no antibiotics for now,.  4.  Fever: Resolved  5.  Chronic subdural hematoma: No longer a candidate for anticoagulation for her atrial fibrillation.  6.  Chronic atrial fibrillation cannot receive anticoagulation but is fairly well controlled and has a pacemaker in place.  7.  Pacemaker: Noted.  8.  Goals of care: Admitting provider  had a long discussion with the patient's son,  showed him his mother's CT scan.  She has a very highly damaged brain.  Initially with a stroke now with loss of volume due to atrophy from the stroke and then the subdural hematoma.  I agree her recurrent seizures are a function of the degree of damage her brain has suffered.  She has not spoken in 2 weeks and has had significant decline.  When she initially moved to the [place] area she was able to feed herself.  She has not been able to do that anymore.  When admitting providerI briefly discussed with him ventilators and life support as well as shocking the patient.   son  was very surprised.  Stated he had not had this frank a discussion with the physician ever.  Admitting provider consulted palliative medicine for further evaluation and discussion. I agree, the patient would benefit from hospice care but this has not mention this to the patient's son yet.  DVT prophylaxis: SCD/Compression stockings  Code Status: Full    Code Status Orders  (From admission, onward)         Start     Ordered   06/09/18 1635  Full code  Continuous     06/09/18 1637        Code  Status History    Date Active Date Inactive Code Status Order ID Comments User Context   06/01/2018 0439 06/08/2018 0138 Full Code [id number]  John Giovanni, MD ED     Family Communication: none present - awaiitng son  Disposition Plan:   home vs snf vs hospice Consults called: None Admission status: Observation   Consultants:   palliative  Procedures:  Dg Chest 1 View  Result Date: 06/01/2018 CLINICAL DATA:  Hypotension and altered mental status. EXAM: CHEST  1 VIEW COMPARISON:  None. FINDINGS: Cardiomegaly with aortic atherosclerosis. Hilar prominence likely representing prominent pulmonary vasculature is noted likely representing stigmata of pulmonary hypertension. No acute pulmonary consolidation, effusion or pneumothorax. Left-sided pacemaker apparatus with leads in the right atrium and right ventricle are noted. IMPRESSION: Cardiomegaly with aortic atherosclerosis. No acute pulmonary disease. Electronically Signed   By: Tollie Eth M.D.   On: 06/01/2018 01:11   Dg Chest 2 View  Result Date: 06/09/2018 CLINICAL DATA:  63 year old female EXAM: CHEST - 2 VIEW COMPARISON:  None. FINDINGS: Cardiomediastinal silhouette unchanged in size and contour. Low lung volumes with central vascular congestion. Interlobular septal thickening. Opacities at the lung bases obscuring the hemidiaphragms and blunting the costophrenic sulcus on the lateral view. No pneumothorax. Unchanged cardiac pacing device. IMPRESSION: Acute CHF with small pleural effusions. Electronically Signed   By: Gilmer Mor D.O.   On: 06/09/2018 14:34   Ct Head Wo Contrast  Result Date: 06/09/2018 CLINICAL DATA:  63 year old female with seizure like activity today. Recent subdural hematoma suspected. EXAM: CT HEAD WITHOUT CONTRAST TECHNIQUE: Contiguous axial images were obtained from the base of the skull through the vertex without intravenous contrast. COMPARISON:  06/02/2018 and earlier. FINDINGS: Brain: Continued evidence of an  isodense right lateral extra-axial collection measuring 5 millimeters (series 5, image 37) stable over this series of recent exams. No associated midline shift or intracranial mass effect. Superimposed cystic encephalomalacia in the right temporal and parietal lobes and confluent right greater than left cerebral white matter hypodensity with ex vacuo appearing enlargement of the right lateral ventricle. Stable gray-white matter differentiation throughout the brain. No cortically based acute infarct identified. Vascular: Calcified atherosclerosis at the skull base. No suspicious intracranial vascular hyperdensity. Skull: No acute osseous abnormality identified. Sinuses/Orbits: Mild to moderate mucosal thickening in the right frontal and posterior ethmoids is stable. Generally well pneumatized paranasal sinuses otherwise. Stable right mastoid effusion. Right tympanic cavity remains clear. Other: Leftward gaze deviation otherwise negative orbits. No acute scalp soft tissue findings. IMPRESSION: 1. Stable non contrast CT appearance of the brain since 06/01/2018. 2. Suspected isodense right lateral subdural hematoma measuring 5 mm. No intracranial mass effect. 3. Cystic appearing encephalomalacia in the right temporal and parietal lobes with confluent right greater than left cerebral white matter hypodensity. Electronically Signed   By: Odessa Fleming M.D.   On: 06/09/2018 14:39   Ct Head Wo  Contrast  Result Date: 06/02/2018 CLINICAL DATA:  63 year old female presenting with hypotension and altered mental status found to have right side subdural hematoma on head CT. EXAM: CT HEAD WITHOUT CONTRAST TECHNIQUE: Contiguous axial images were obtained from the base of the skull through the vertex without intravenous contrast. COMPARISON:  06/01/2018. FINDINGS: Brain: There does seem to be an isodense right side subdural hematoma (coronal image 26) superimposed on multifocal areas of right hemisphere cystic encephalomalacia, and  right lateral ventricle ex vacuo enlargement. Hematoma size is estimated at 4-5 millimeters, but there is no midline shift or significant intracranial mass effect. There is a superimposed small 5 millimeter parafalcine subdural hematoma suspected on series 3, image 25. In the left hemisphere there is confluent white matter hypodensity. There is generalized cerebral volume loss. There is thickening of the pituitary infundibulum (coronal image 29) with no intra sellar or suprasellar mass effect. No definite acute cortically based infarcts; right PCA territory hypodensity is favored to be chronic in light of right occipital horn enlargement. No new intracranial blood products. Vascular: Calcified atherosclerosis at the skull base. No suspicious intracranial vascular hyperdensity. Skull: Hyperostosis, a normal variant. No skull fracture or acute osseous abnormality identified. Sinuses/Orbits: Right mastoid effusion and right frontal sinus bubbly opacity are stable. Tympanic cavities, left mastoids, and most of the other paranasal sinuses remain well pneumatized. Other: No acute orbit or scalp soft tissue finding. IMPRESSION: 1. Isodense right side subdural hematoma again suspected, 4-5 mm in thickness, with small 5 mm parafalcine subdural also. No intracranial mass effect.  No associated skull fracture. Multifocal encephalomalacia in the right hemisphere with ex vacuo ventricular enlargement. 2. Nonspecific thickening of the pituitary infundibulum. Query Diabetes Insipidus or Hypopituitarism. Electronically Signed   By: Odessa Fleming M.D.   On: 06/02/2018 14:26   Ct Head Wo Contrast  Result Date: 06/01/2018 CLINICAL DATA:  63 year old female with hypotension and altered mental status. EXAM: CT HEAD WITHOUT CONTRAST TECHNIQUE: Contiguous axial images were obtained from the base of the skull through the vertex without intravenous contrast. COMPARISON:  None. FINDINGS: Brain: Acute right-sided subdural overlying the convexity  the brain measuring up to 4.3 mm in thickness. Right parafalcine subdural hematoma is also noted measuring to 3.5 mm in thickness as well as the right tentorium measuring up to 1.4 mm. Remote appearing right temporal occipital infarct with adjacent encephalomalacia right lateral ventricle. An age-indeterminate brainstem infarct is also seen. Hyperdensities along the cerebellopontine angles compatible choroid plexus calcifications the sagittal view. Moderate degree of small vessel ischemia is otherwise noted. Age related involutional changes of brain are noted. Vascular: No hyperdense vessel sign. Skull: No acute fracture or suspicious osseous lesions. Sinuses/Orbits: Right frontal, right maxillary and ethmoid sinus mucosal thickening. Intact orbits and globes. Other: None IMPRESSION: 1. Acute right-sided subdural hematomas as above along the convexity of the brain, falx and right tentorium measuring up to 4.3 mm in thickness. 2. Chronic moderate small vessel ischemic disease. 3. Chronic right temporal occipital lobe infarct with adjacent encephalomalacia the right lateral ventricle. 4. Age-indeterminate brainstem infarct. These results were called by telephone at the time of interpretation on 06/01/2018 at 2:00 am to Dr. Geoffery Lyons , who verbally acknowledged these results. Electronically Signed   By: Tollie Eth M.D.   On: 06/01/2018 02:04   US Renal  Result Date: 06/01/2018 CLINICAL DATA:  Acute kidney injury. EXAM: RENAL / URINARY TRACT ULTRASOUND COMPLETE COMPARISON:  None. FINDINGS: Right Kidney: Renal measurements: 12.3 x 5.6 x 7.4 cm = volume:  267 mL . Echogenicity within normal limits. No mass or hydronephrosis visualized. Left Kidney: Renal measurements: 10.5 x 4.7 x 4.4 cm = volume: 112 mL. Echogenicity within normal limits. No mass or hydronephrosis visualized. Bladder: Appears normal for degree of bladder distention. IMPRESSION: No significant renal abnormality seen. Electronically Signed   By: Lupita Raider, M.D.   On: 06/01/2018 07:22     Antimicrobials:   None currently, received cipro x 1 in the ER    Subjective: Patient is minimally interactive.  Her no acute events overnight, palliative care consultation pending.  Objective: Vitals:   06/09/18 2133 06/09/18 2311 06/10/18 0314 06/10/18 0739  BP:  (!) 141/71 116/76 (!) 105/42  Pulse:  60 68 79  Resp:  19 19 14   Temp:  (!) 97.5 F (36.4 C) 98.8 F (37.1 C) 98.3 F (36.8 C)  TempSrc:  Axillary Axillary Oral  SpO2: 95% 94% 96% 100%  Weight:  84 kg    Height:  5\' 5"  (1.651 m)      Intake/Output Summary (Last 24 hours) at 06/10/2018 1130 Last data filed at 06/10/2018 0800 Gross per 24 hour  Intake 1060.51 ml  Output 1600 ml  Net -539.49 ml   Filed Weights   06/09/18 2311  Weight: 84 kg    Examination:  General exam: Patient is calm, comfortable Respiratory system: Clear to auscultation. Respiratory effort normal. Cardiovascular system: S1 & S2 heard, RRR. No JVD, murmurs, rubs, gallops or clicks. No pedal edema. Gastrointestinal system: Abdomen is nondistended, soft and nontender. No organomegaly or masses felt. Normal bowel sounds heard. Central nervous system: Alert but minimally interactive,. Extremities: Symmetric 5 x 5 power.  Base edema Skin: No rashes, lesions or ulcers Psychiatry: Unable to accurately assess secondary to profound cognitive decline in the setting of setting with severe cortical atrophy and scarring from multiple strokes    Data Reviewed: I have personally reviewed following labs and imaging studies  CBC: Recent Labs  Lab 06/04/18 0746 06/06/18 0520 06/09/18 1320  WBC 4.0 5.0 9.3  NEUTROABS  --   --  6.8  HGB 10.7* 11.2* 10.4*  HCT 35.6* 34.9* 35.6*  MCV 86.4 85.3 90.8  PLT 70* 59* PLATELET CLUMPS NOTED ON SMEAR, UNABLE TO ESTIMATE   Basic Metabolic Panel: Recent Labs  Lab 06/04/18 0746 06/05/18 0520 06/06/18 0520 06/07/18 1413 06/09/18 1320  NA 147* 146* 146* 145  150*  K 3.4* 3.7 4.1 4.1 4.0  CL 117* 118* 116* 117* 118*  CO2 21* 24 19* 21* 23  GLUCOSE 109* 139* 89 108* 104*  BUN <5* <5* <5* <5* <5*  CREATININE 0.74 0.85 0.88 0.97 0.79  CALCIUM 8.8* 8.5* 8.7* 8.6* 9.1  MG  --   --   --   --  1.9   GFR: Estimated Creatinine Clearance: 76 mL/min (by C-G formula based on SCr of 0.79 mg/dL). Liver Function Tests: Recent Labs  Lab 06/09/18 1320  AST 65*  ALT 25  ALKPHOS 136*  BILITOT 0.9  PROT 8.3*  ALBUMIN 2.6*   No results for input(s): LIPASE, AMYLASE in the last 168 hours. No results for input(s): AMMONIA in the last 168 hours. Coagulation Profile: No results for input(s): INR, PROTIME in the last 168 hours. Cardiac Enzymes: Recent Labs  Lab 06/09/18 1320  TROPONINI <0.03   BNP (last 3 results) No results for input(s): PROBNP in the last 8760 hours. HbA1C: No results for input(s): HGBA1C in the last 72 hours. CBG: No results for  input(s): GLUCAP in the last 168 hours. Lipid Profile: No results for input(s): CHOL, HDL, LDLCALC, TRIG, CHOLHDL, LDLDIRECT in the last 72 hours. Thyroid Function Tests: No results for input(s): TSH, T4TOTAL, FREET4, T3FREE, THYROIDAB in the last 72 hours. Anemia Panel: No results for input(s): VITAMINB12, FOLATE, FERRITIN, TIBC, IRON, RETICCTPCT in the last 72 hours. Sepsis Labs: Recent Labs  Lab 06/09/18 1821 06/09/18 2230  LATICACIDVEN 1.9 1.9    Recent Results (from the past 240 hour(s))  Culture, Urine     Status: Abnormal   Collection Time: 06/01/18  2:03 AM  Result Value Ref Range Status   Specimen Description URINE, RANDOM  Final   Special Requests   Final    NONE Performed at [facility], [address] [place], [place] [address]    Culture >=100,000 COLONIES/mL KLEBSIELLA PNEUMONIAE (A)  Final   Report Status 06/04/2018 FINAL  Final   Organism ID, Bacteria KLEBSIELLA PNEUMONIAE (A)  Final      Susceptibility   Klebsiella pneumoniae - MIC*    AMPICILLIN RESISTANT  Resistant     CEFAZOLIN <=4 SENSITIVE Sensitive     CEFTRIAXONE <=1 SENSITIVE Sensitive     CIPROFLOXACIN <=0.25 SENSITIVE Sensitive     GENTAMICIN <=1 SENSITIVE Sensitive     IMIPENEM <=0.25 SENSITIVE Sensitive     NITROFURANTOIN 64 INTERMEDIATE Intermediate     TRIMETH/SULFA <=20 SENSITIVE Sensitive     AMPICILLIN/SULBACTAM 4 SENSITIVE Sensitive     PIP/TAZO <=4 SENSITIVE Sensitive     Extended ESBL NEGATIVE Sensitive     * >=100,000 COLONIES/mL KLEBSIELLA PNEUMONIAE  MRSA PCR Screening     Status: None   Collection Time: 06/01/18  5:52 AM  Result Value Ref Range Status   MRSA by PCR NEGATIVE NEGATIVE Final    Comment:        The GeneXpert MRSA Assay (FDA approved for NASAL specimens only), is one component of a comprehensive MRSA colonization surveillance program. It is not intended to diagnose MRSA infection nor to guide or monitor treatment for MRSA infections. Performed at [facility], [address] [place], [place] [address]   Urine culture     Status: Abnormal (Preliminary result)   Collection Time: 06/09/18  2:29 PM  Result Value Ref Range Status   Specimen Description URINE, CLEAN CATCH  Final   Special Requests NONE  Final   Culture (A)  Final    >=100,000 COLONIES/mL PROTEUS MIRABILIS SUSCEPTIBILITIES TO FOLLOW Performed at [facility], [address] [place], [place] [address]    Report Status PENDING  Incomplete         Radiology Studies: Dg Chest 2 View  Result Date: 06/09/2018 CLINICAL DATA:  63 year old female EXAM: CHEST - 2 VIEW COMPARISON:  None. FINDINGS: Cardiomediastinal silhouette unchanged in size and contour. Low lung volumes with central vascular congestion. Interlobular septal thickening. Opacities at the lung bases obscuring the hemidiaphragms and blunting the costophrenic sulcus on the lateral view. No pneumothorax. Unchanged cardiac pacing device. IMPRESSION: Acute CHF with small pleural effusions. Electronically Signed    By: Gilmer MorJaime  Wagner D.O.   On: 06/09/2018 14:34   Ct Head Wo Contrast  Result Date: 06/09/2018 CLINICAL DATA:  63 year old female with seizure like activity today. Recent subdural hematoma suspected. EXAM: CT HEAD WITHOUT CONTRAST TECHNIQUE: Contiguous axial images were obtained from the base of the skull through the vertex without intravenous contrast. COMPARISON:  06/02/2018 and earlier. FINDINGS: Brain: Continued evidence of an isodense right  lateral extra-axial collection measuring 5 millimeters (series 5, image 37) stable over this series of recent exams. No associated midline shift or intracranial mass effect. Superimposed cystic encephalomalacia in the right temporal and parietal lobes and confluent right greater than left cerebral white matter hypodensity with ex vacuo appearing enlargement of the right lateral ventricle. Stable gray-white matter differentiation throughout the brain. No cortically based acute infarct identified. Vascular: Calcified atherosclerosis at the skull base. No suspicious intracranial vascular hyperdensity. Skull: No acute osseous abnormality identified. Sinuses/Orbits: Mild to moderate mucosal thickening in the right frontal and posterior ethmoids is stable. Generally well pneumatized paranasal sinuses otherwise. Stable right mastoid effusion. Right tympanic cavity remains clear. Other: Leftward gaze deviation otherwise negative orbits. No acute scalp soft tissue findings. IMPRESSION: 1. Stable non contrast CT appearance of the brain since 06/01/2018. 2. Suspected isodense right lateral subdural hematoma measuring 5 mm. No intracranial mass effect. 3. Cystic appearing encephalomalacia in the right temporal and parietal lobes with confluent right greater than left cerebral white matter hypodensity. Electronically Signed   By: Odessa FlemingH  Hall M.D.   On: 06/09/2018 14:39        Scheduled Meds: . senna  1 tablet Oral BID   Continuous Infusions: . sodium chloride 75 mL/hr (06/10/18  0952)     LOS: 0 days    Time spent: 35 min    Burke Keelshristopher Necia Kamm, MD [facility]  If 7PM-7AM, please contact night-coverage  06/10/2018, 11:30 AM

## 2018-06-11 ENCOUNTER — Inpatient Hospital Stay (HOSPITAL_COMMUNITY): Payer: Medicare Other

## 2018-06-11 DIAGNOSIS — N390 Urinary tract infection, site not specified: Secondary | ICD-10-CM

## 2018-06-11 LAB — BASIC METABOLIC PANEL
Anion gap: 8 (ref 5–15)
BUN: <5 mg/dL — ABNORMAL LOW (ref 8–23)
CO2: 18 mmol/L — ABNORMAL LOW (ref 22–32)
Calcium: 8.4 mg/dL — ABNORMAL LOW (ref 8.9–10.3)
Chloride: 129 mmol/L — ABNORMAL HIGH (ref 98–111)
Creatinine, Ser: 0.7 mg/dL (ref 0.44–1.00)
GFR calc Af Amer: >60 mL/min (ref >60)
GFR calc non Af Amer: >60 mL/min (ref >60)
Glucose, Bld: 83 mg/dL (ref 70–99)
Potassium: 5.6 mmol/L — ABNORMAL HIGH (ref 3.5–5.1)
Sodium: 155 mmol/L — ABNORMAL HIGH (ref 135–145)

## 2018-06-11 LAB — PHENOBARBITAL LEVEL: Phenobarbital: 21.2 ug/mL (ref 15.0–30.0)

## 2018-06-11 LAB — URINE CULTURE: Culture: >=100000 — AB

## 2018-06-11 MED ORDER — SODIUM CHLORIDE 0.9 % IV SOLN
100.0000 mg | INTRAVENOUS | Status: AC
  Administered 2018-06-11: 100 mg via INTRAVENOUS
  Filled 2018-06-11: qty 10

## 2018-06-11 MED ORDER — DEXTROSE 5 % IV SOLN
INTRAVENOUS | Status: DC
  Administered 2018-06-11 – 2018-06-17 (×9): via INTRAVENOUS

## 2018-06-11 MED ORDER — POTASSIUM CL IN DEXTROSE 5% 20 MEQ/L IV SOLN
20.0000 meq | INTRAVENOUS | Status: DC
  Administered 2018-06-11: 20 meq via INTRAVENOUS
  Filled 2018-06-11: qty 1000

## 2018-06-11 MED ORDER — LACOSAMIDE 200 MG PO TABS
200.0000 mg | ORAL_TABLET | Freq: Two times a day (BID) | ORAL | Status: DC
  Administered 2018-06-11 – 2018-06-13 (×5): 200 mg via ORAL
  Filled 2018-06-11 (×5): qty 1

## 2018-06-11 NOTE — Procedures (Signed)
History: 63 year old female being evaluated for seizures  Sedation: None  Technique: This is a 21 channel routine scalp EEG performed at the bedside with bipolar and monopolar montages arranged in accordance to the international 10/20 system of electrode placement. One channel was dedicated to EKG recording.    Background: The background is predominantly generalized irregular delta and theta activities.  The posterior dominant rhythm is relatively poorly sustained and achieves a maximal frequency of 6 to 7 Hz.  In addition there is focal right temporal irregular delta range slow activity.  Photic stimulation: Physiologic driving is not performed  EEG Abnormalities: 1) focal right temporal delta 2) generalized irregular slow activity 3) slow posterior dominant rhythm  Clinical Interpretation: This EEG is consistent with an area of focal nonspecific cerebral disturbance in the right temporal region in the setting of a more generalized nonspecific cerebral dysfunction (encephalopathy). There was no seizure or seizure predisposition recorded on this study. Please note that lack of epileptiform activity on EEG does not preclude the possibility of epilepsy.   Ritta Slot, MD [facility] [phone]  If 7pm- 7am, please page neurology on call as listed in AMION.

## 2018-06-11 NOTE — Progress Notes (Addendum)
PROGRESS NOTE    Ana Collins  ZOX:[id number] DOB: 01-22-1956 DOA: 06/09/2018 PCP: Karna Dupes, MD   Brief Narrative:  Per admission: Ana Collins a 63 y.o.femalewith medical history significant ofatrial fibrillation with resultant stroke was on Eliquis until the fall when she had a subdural hematoma and that was discontinued. Congestive heart failure, hypertension, seizures due to stroke, came in to the emergency department via EMS actively seizing. Patient had a witnessed focal seizure while at the facility she was discharged from the hospital due to altered mental status and urinary tract infection on February 7. Her son is at the bedside and provides a history. Placed in a nursing home back in [place] many years ago. She was there after her initial stroke approximately 2010 or 2011. Since then she has moved to the [facility] area and now here. Her son reports that she had a pacemaker placed in the [facility] area however we are unable to locate records at either [facility] or [facility]. In November 2019 patient had a subdural hematoma. While she had seizure problems prior to the subdural since the subdural she has had increased episodes of seizures. Patient has a pacemaker in place.  ED Course:Seizures treated with IV Keppra, lorazepam, and abnormal urinalysis treated with Cipro.    Assessment & Plan:   Principal Problem:   Seizure, late effect of stroke (HCC) Active Problems:   Chronic subdural hematoma (HCC)   Hypernatremia   Abnormal urinalysis   Atrial fibrillation, chronic   Pacemaker   Fever   Seizure as late effect of cerebrovascular accident (CVA) (HCC)   1. Seizure as a late effect of stroke: As previously noted, likely worsened by subdural hematoma and have become more frequent. Despite increases in Keppra, phenobarbital and addition of Vimpat.  Neurology saw in consultation and recommended to continue current antiepileptics, EEG still  pending.  As noted by admitting provider, patient was febrile at the time of admission. She was recently treated for a Klebsiella UTI and finished a full course of treatment 2 days PTA.   Urine cultures been collected, antibiotics were not continued initially but ceftriaxone was added back 2/2 urine culture showing  Proteus sensitive to current antibiotic of ceftriaxone day #2.Marland Kitchen Patient remains afebrile with a normal white count  2. Hypernatremia: We will add in D5 half-normal saline, recheck a BMP.  3. UTI.  Culture showed Proteus treat with ceftriaxone as above.  4. Fever: Resolved  5. Chronic subdural hematoma: No longer a candidate for anticoagulation for her atrial fibrillation.  6. Chronic atrial fibrillation cannot receive anticoagulation but is fairly well controlled and has a pacemaker in place.  7. Pacemaker: Noted.  8. Goals of care:  Tried reaching son today discussed plan of care, unsuccessful, left voicemail. Palliative care consult pending.  Admitting provider  had a long discussion with the patient's son,  showed him his mother's CT scan. She has a very highly damaged brain. Initially with a stroke now with loss of volume due to atrophy from the stroke and then the subdural hematoma. I agree her recurrent seizures are a function of the degree of damage her brain has suffered. She has not spoken in 2 weeks and has had significant decline. When she initially moved to the [place] area she was able to feed herself. She has not been able to do that anymore. When admitting providerI briefly discussed with him ventilators and life support as well as shocking the patient.  son was very surprised.  Stated he had not had this frank a discussion with the physician ever. Admitting provider consulted palliative medicine for further evaluation and discussion. I agree, the patient would benefit from hospice care but this has not mention this to the patient's son  yet.  DVT prophylaxis: SCD/Compression stockings  Code Status: FULL    Code Status Orders  (From admission, onward)         Start     Ordered   06/09/18 1635  Full code  Continuous     06/09/18 1637        Code Status History    Date Active Date Inactive Code Status Order ID Comments User Context   06/01/2018 0439 06/08/2018 0138 Full Code [id number]  [name], Vasundhra, MD ED     Family Communication: Tried calling son at [phone] the left voicemail Disposition Plan:   Unclear Consults called: None Admission status: Inpatient   Consultants:   Neurology  Procedures:  Dg Chest 1 View  Result Date: 06/01/2018 CLINICAL DATA:  Hypotension and altered mental status. EXAM: CHEST  1 VIEW COMPARISON:  None. FINDINGS: Cardiomegaly with aortic atherosclerosis. Hilar prominence likely representing prominent pulmonary vasculature is noted likely representing stigmata of pulmonary hypertension. No acute pulmonary consolidation, effusion or pneumothorax. Left-sided pacemaker apparatus with leads in the right atrium and right ventricle are noted. IMPRESSION: Cardiomegaly with aortic atherosclerosis. No acute pulmonary disease. Electronically Signed   By: Tollie Ethavid  Kwon M.D.   On: 06/01/2018 01:11   Dg Chest 2 View  Result Date: 06/09/2018 CLINICAL DATA:  63 year old female EXAM: CHEST - 2 VIEW COMPARISON:  None. FINDINGS: Cardiomediastinal silhouette unchanged in size and contour. Low lung volumes with central vascular congestion. Interlobular septal thickening. Opacities at the lung bases obscuring the hemidiaphragms and blunting the costophrenic sulcus on the lateral view. No pneumothorax. Unchanged cardiac pacing device. IMPRESSION: Acute CHF with small pleural effusions. Electronically Signed   By: Gilmer MorJaime  Wagner D.O.   On: 06/09/2018 14:34   Ct Head Wo Contrast  Result Date: 06/09/2018 CLINICAL DATA:  63 year old female with seizure like activity today. Recent subdural hematoma suspected.  EXAM: CT HEAD WITHOUT CONTRAST TECHNIQUE: Contiguous axial images were obtained from the base of the skull through the vertex without intravenous contrast. COMPARISON:  06/02/2018 and earlier. FINDINGS: Brain: Continued evidence of an isodense right lateral extra-axial collection measuring 5 millimeters (series 5, image 37) stable over this series of recent exams. No associated midline shift or intracranial mass effect. Superimposed cystic encephalomalacia in the right temporal and parietal lobes and confluent right greater than left cerebral white matter hypodensity with ex vacuo appearing enlargement of the right lateral ventricle. Stable gray-white matter differentiation throughout the brain. No cortically based acute infarct identified. Vascular: Calcified atherosclerosis at the skull base. No suspicious intracranial vascular hyperdensity. Skull: No acute osseous abnormality identified. Sinuses/Orbits: Mild to moderate mucosal thickening in the right frontal and posterior ethmoids is stable. Generally well pneumatized paranasal sinuses otherwise. Stable right mastoid effusion. Right tympanic cavity remains clear. Other: Leftward gaze deviation otherwise negative orbits. No acute scalp soft tissue findings. IMPRESSION: 1. Stable non contrast CT appearance of the brain since 06/01/2018. 2. Suspected isodense right lateral subdural hematoma measuring 5 mm. No intracranial mass effect. 3. Cystic appearing encephalomalacia in the right temporal and parietal lobes with confluent right greater than left cerebral white matter hypodensity. Electronically Signed   By: Odessa FlemingH  Hall M.D.   On: 06/09/2018 14:39   Ct Head Wo Contrast  Result Date: 06/02/2018 CLINICAL DATA:  63 year old female presenting with hypotension and altered mental status found to have right side subdural hematoma on head CT. EXAM: CT HEAD WITHOUT CONTRAST TECHNIQUE: Contiguous axial images were obtained from the base of the skull through the vertex  without intravenous contrast. COMPARISON:  06/01/2018. FINDINGS: Brain: There does seem to be an isodense right side subdural hematoma (coronal image 26) superimposed on multifocal areas of right hemisphere cystic encephalomalacia, and right lateral ventricle ex vacuo enlargement. Hematoma size is estimated at 4-5 millimeters, but there is no midline shift or significant intracranial mass effect. There is a superimposed small 5 millimeter parafalcine subdural hematoma suspected on series 3, image 25. In the left hemisphere there is confluent white matter hypodensity. There is generalized cerebral volume loss. There is thickening of the pituitary infundibulum (coronal image 29) with no intra sellar or suprasellar mass effect. No definite acute cortically based infarcts; right PCA territory hypodensity is favored to be chronic in light of right occipital horn enlargement. No new intracranial blood products. Vascular: Calcified atherosclerosis at the skull base. No suspicious intracranial vascular hyperdensity. Skull: Hyperostosis, a normal variant. No skull fracture or acute osseous abnormality identified. Sinuses/Orbits: Right mastoid effusion and right frontal sinus bubbly opacity are stable. Tympanic cavities, left mastoids, and most of the other paranasal sinuses remain well pneumatized. Other: No acute orbit or scalp soft tissue finding. IMPRESSION: 1. Isodense right side subdural hematoma again suspected, 4-5 mm in thickness, with small 5 mm parafalcine subdural also. No intracranial mass effect.  No associated skull fracture. Multifocal encephalomalacia in the right hemisphere with ex vacuo ventricular enlargement. 2. Nonspecific thickening of the pituitary infundibulum. Query Diabetes Insipidus or Hypopituitarism. Electronically Signed   By: Odessa Fleming M.D.   On: 06/02/2018 14:26   Ct Head Wo Contrast  Result Date: 06/01/2018 CLINICAL DATA:  63 year old female with hypotension and altered mental status. EXAM:  CT HEAD WITHOUT CONTRAST TECHNIQUE: Contiguous axial images were obtained from the base of the skull through the vertex without intravenous contrast. COMPARISON:  None. FINDINGS: Brain: Acute right-sided subdural overlying the convexity the brain measuring up to 4.3 mm in thickness. Right parafalcine subdural hematoma is also noted measuring to 3.5 mm in thickness as well as the right tentorium measuring up to 1.4 mm. Remote appearing right temporal occipital infarct with adjacent encephalomalacia right lateral ventricle. An age-indeterminate brainstem infarct is also seen. Hyperdensities along the cerebellopontine angles compatible choroid plexus calcifications the sagittal view. Moderate degree of small vessel ischemia is otherwise noted. Age related involutional changes of brain are noted. Vascular: No hyperdense vessel sign. Skull: No acute fracture or suspicious osseous lesions. Sinuses/Orbits: Right frontal, right maxillary and ethmoid sinus mucosal thickening. Intact orbits and globes. Other: None IMPRESSION: 1. Acute right-sided subdural hematomas as above along the convexity of the brain, falx and right tentorium measuring up to 4.3 mm in thickness. 2. Chronic moderate small vessel ischemic disease. 3. Chronic right temporal occipital lobe infarct with adjacent encephalomalacia the right lateral ventricle. 4. Age-indeterminate brainstem infarct. These results were called by telephone at the time of interpretation on 06/01/2018 at 2:00 am to Dr. Geoffery Lyons , who verbally acknowledged these results. Electronically Signed   By: Tollie Eth M.D.   On: 06/01/2018 02:04   US Renal  Result Date: 06/01/2018 CLINICAL DATA:  Acute kidney injury. EXAM: RENAL / URINARY TRACT ULTRASOUND COMPLETE COMPARISON:  None. FINDINGS: Right Kidney: Renal measurements: 12.3 x 5.6 x 7.4 cm = volume: 267 mL . Echogenicity within normal limits. No  mass or hydronephrosis visualized. Left Kidney: Renal measurements: 10.5 x 4.7 x 4.4  cm = volume: 112 mL. Echogenicity within normal limits. No mass or hydronephrosis visualized. Bladder: Appears normal for degree of bladder distention. IMPRESSION: No significant renal abnormality seen. Electronically Signed   By: Lupita RaiderJames  Green Jr, M.D.   On: 06/01/2018 07:22     Antimicrobials:   Ceftriaxone 1 g daily day #2   Subjective: Patient still remains minimally interactive, unable to answer questions appropriately.  Objective: Vitals:   06/10/18 2338 06/11/18 0404 06/11/18 0800 06/11/18 0905  BP: (!) 114/56 125/68  (!) 154/94  Pulse: (!) 59 66  62  Resp: 17 (!) 22  17  Temp: 98.3 F (36.8 C) 98.8 F (37.1 C)  98 F (36.7 C)  TempSrc: Oral Oral  Oral  SpO2: 99% 98%    Weight:      Height:   5\' 5"  (1.651 m)     Intake/Output Summary (Last 24 hours) at 06/11/2018 1117 Last data filed at 06/11/2018 0800 Gross per 24 hour  Intake 2251.86 ml  Output 500 ml  Net 1751.86 ml   Filed Weights   06/09/18 2311  Weight: 84 kg    Examination:  General exam: Calm, unable to answer questions. Respiratory system: Mild rhonchi upper airway, no wheezing no lower airway rhonchi Cardiovascular system: S1 & S2 heard, RRR. No JVD, murmurs, rubs, gallops or clicks. No pedal edema. Gastrointestinal system: Abdomen is nondistended, soft and nontender. No organomegaly or masses felt. Normal bowel sounds heard. Central nervous system: Not interactive grunting in response grimacing to pain. Extremities: Symmetric 5 x 5 power.  His edema Skin: No rashes, lesions or ulcers Psychiatry: Unable to accurately assess as patient is noninteractive    Data Reviewed: I have personally reviewed following labs and imaging studies  CBC: Recent Labs  Lab 06/06/18 0520 06/09/18 1320 06/10/18 1145  WBC 5.0 9.3 8.8  NEUTROABS  --  6.8 6.7  HGB 11.2* 10.4* 10.1*  HCT 34.9* 35.6* 34.3*  MCV 85.3 90.8 90.0  PLT 59* PLATELET CLUMPS NOTED ON SMEAR, UNABLE TO ESTIMATE 103*   Basic Metabolic  Panel: Recent Labs  Lab 06/05/18 0520 06/06/18 0520 06/07/18 1413 06/09/18 1320 06/10/18 1145  NA 146* 146* 145 150* 156*  K 3.7 4.1 4.1 4.0 3.5  CL 118* 116* 117* 118* 125*  CO2 24 19* 21* 23 24  GLUCOSE 139* 89 108* 104* 111*  BUN <5* <5* <5* <5* 5*  CREATININE 0.85 0.88 0.97 0.79 0.82  CALCIUM 8.5* 8.7* 8.6* 9.1 8.3*  MG  --   --   --  1.9  --    GFR: Estimated Creatinine Clearance: 74.2 mL/min (by C-G formula based on SCr of 0.82 mg/dL). Liver Function Tests: Recent Labs  Lab 06/09/18 1320  AST 65*  ALT 25  ALKPHOS 136*  BILITOT 0.9  PROT 8.3*  ALBUMIN 2.6*   No results for input(s): LIPASE, AMYLASE in the last 168 hours. No results for input(s): AMMONIA in the last 168 hours. Coagulation Profile: No results for input(s): INR, PROTIME in the last 168 hours. Cardiac Enzymes: Recent Labs  Lab 06/09/18 1320  TROPONINI <0.03   BNP (last 3 results) No results for input(s): PROBNP in the last 8760 hours. HbA1C: No results for input(s): HGBA1C in the last 72 hours. CBG: No results for input(s): GLUCAP in the last 168 hours. Lipid Profile: No results for input(s): CHOL, HDL, LDLCALC, TRIG, CHOLHDL, LDLDIRECT in the last 72 hours.  Thyroid Function Tests: No results for input(s): TSH, T4TOTAL, FREET4, T3FREE, THYROIDAB in the last 72 hours. Anemia Panel: No results for input(s): VITAMINB12, FOLATE, FERRITIN, TIBC, IRON, RETICCTPCT in the last 72 hours. Sepsis Labs: Recent Labs  Lab 06/09/18 1821 06/09/18 2230  LATICACIDVEN 1.9 1.9    Recent Results (from the past 240 hour(s))  Blood culture (routine x 2)     Status: None (Preliminary result)   Collection Time: 06/09/18  1:20 PM  Result Value Ref Range Status   Specimen Description BLOOD LEFT ANTECUBITAL  Final   Special Requests   Final    BOTTLES DRAWN AEROBIC ONLY Blood Culture adequate volume   Culture   Final    NO GROWTH 2 DAYS Performed at [facility], [address] [place], [place]  [address]    Report Status PENDING  Incomplete  Urine culture     Status: Abnormal   Collection Time: 06/09/18  2:29 PM  Result Value Ref Range Status   Specimen Description URINE, CLEAN CATCH  Final   Special Requests   Final    NONE Performed at [facility], [address], [place], [place] [address]    Culture >=100,000 COLONIES/mL PROTEUS MIRABILIS (A)  Final   Report Status 06/11/2018 FINAL  Final   Organism ID, Bacteria PROTEUS MIRABILIS (A)  Final      Susceptibility   Proteus mirabilis - MIC*    AMPICILLIN 8 SENSITIVE Sensitive     CEFAZOLIN 8 SENSITIVE Sensitive     CEFTRIAXONE <=1 SENSITIVE Sensitive     CIPROFLOXACIN >=4 RESISTANT Resistant     GENTAMICIN 8 INTERMEDIATE Intermediate     IMIPENEM 2 SENSITIVE Sensitive     NITROFURANTOIN 256 RESISTANT Resistant     TRIMETH/SULFA >=320 RESISTANT Resistant     AMPICILLIN/SULBACTAM 4 SENSITIVE Sensitive     PIP/TAZO <=4 SENSITIVE Sensitive     * >=100,000 COLONIES/mL PROTEUS MIRABILIS  Blood culture (routine x 2)     Status: None (Preliminary result)   Collection Time: 06/09/18  6:10 PM  Result Value Ref Range Status   Specimen Description BLOOD LEFT HAND  Final   Special Requests   Final    AEROBIC BOTTLE ONLY Blood Culture results may not be optimal due to an inadequate volume of blood received in culture bottles   Culture   Final    NO GROWTH 2 DAYS Performed at [facility], [address], [place], [place] [address]    Report Status PENDING  Incomplete         Radiology Studies: Dg Chest 2 View  Result Date: 06/09/2018 CLINICAL DATA:  63 year old female EXAM: CHEST - 2 VIEW COMPARISON:  None. FINDINGS: Cardiomediastinal silhouette unchanged in size and contour. Low lung volumes with central vascular congestion. Interlobular septal thickening. Opacities at the lung bases obscuring the hemidiaphragms and blunting the costophrenic sulcus on the lateral view. No pneumothorax. Unchanged cardiac pacing  device. IMPRESSION: Acute CHF with small pleural effusions. Electronically Signed   By: Gilmer Mor D.O.   On: 06/09/2018 14:34   Ct Head Wo Contrast  Result Date: 06/09/2018 CLINICAL DATA:  63 year old female with seizure like activity today. Recent subdural hematoma suspected. EXAM: CT HEAD WITHOUT CONTRAST TECHNIQUE: Contiguous axial images were obtained from the base of the skull through the vertex without intravenous contrast. COMPARISON:  06/02/2018 and earlier. FINDINGS: Brain: Continued evidence of an isodense right lateral extra-axial collection measuring 5 millimeters (series 5, image 37) stable  over this series of recent exams. No associated midline shift or intracranial mass effect. Superimposed cystic encephalomalacia in the right temporal and parietal lobes and confluent right greater than left cerebral white matter hypodensity with ex vacuo appearing enlargement of the right lateral ventricle. Stable gray-white matter differentiation throughout the brain. No cortically based acute infarct identified. Vascular: Calcified atherosclerosis at the skull base. No suspicious intracranial vascular hyperdensity. Skull: No acute osseous abnormality identified. Sinuses/Orbits: Mild to moderate mucosal thickening in the right frontal and posterior ethmoids is stable. Generally well pneumatized paranasal sinuses otherwise. Stable right mastoid effusion. Right tympanic cavity remains clear. Other: Leftward gaze deviation otherwise negative orbits. No acute scalp soft tissue findings. IMPRESSION: 1. Stable non contrast CT appearance of the brain since 06/01/2018. 2. Suspected isodense right lateral subdural hematoma measuring 5 mm. No intracranial mass effect. 3. Cystic appearing encephalomalacia in the right temporal and parietal lobes with confluent right greater than left cerebral white matter hypodensity. Electronically Signed   By: Odessa Fleming M.D.   On: 06/09/2018 14:39        Scheduled Meds: .  donepezil  10 mg Oral QHS  . lacosamide  100 mg Oral BID  . levETIRAcetam  1,500 mg Oral BID  . LORazepam  0.5 mg Oral BID  . PHENobarbital  64.8 mg Oral QHS  . senna  1 tablet Oral BID   Continuous Infusions: . cefTRIAXone (ROCEPHIN)  IV Stopped (06/10/18 2255)  . dextrose 5 % with KCl 20 mEq / L       LOS: 1 day    Time spent: 57 MIN    Burke Keels, MD [facility]  If 7PM-7AM, please contact night-coverage  06/11/2018, 11:17 AM

## 2018-06-11 NOTE — Progress Notes (Signed)
Reason for consult: Seizures, encephalopathy  Subjective: Patient has remained very somnolent throughout the day.  At times patient is more awake according to the nurse and son and can answer some questions.  At time of assessment patient was very somnolent and did not follow any commands.  Her right upper extremity would have subtle tremor like activity that was qausi-rhythmic.  Improved after receiving Ativan and IV Vimpat.   ROS: Unable to obtain due to poor mental status  Examination  Vital signs in last 24 hours: Temp:  [97.7 F (36.5 C)-99.2 F (37.3 C)] 98.4 F (36.9 C) (02/11 1556) Pulse Rate:  [58-66] 60 (02/11 1556) Resp:  [16-22] 20 (02/11 1556) BP: (112-158)/(56-94) 124/69 (02/11 1556) SpO2:  [94 %-100 %] 95 % (02/11 1556)  General: lying in bed CVS: pulse-normal rate and rhythm RS: breathing comfortably Extremities: normal   Neuro: MS: Patient very somnolent, follows commands intermittently, does not answer questions CN: pupils equal and reactive,  EOMI, face symmetric, tongue midline,  Motor: 3/5 strength in the right upper extremity, 4/5 strength in left upper extremity.  Withdraws in bilateral lower extremities. Reflexes: plantars: flexor Coordination: unable to test Gait: not tested  Basic Metabolic Panel: Recent Labs  Lab 06/06/18 0520 06/07/18 1413 06/09/18 1320 06/10/18 1145 06/11/18 1550  NA 146* 145 150* 156* 155*  K 4.1 4.1 4.0 3.5 5.6*  CL 116* 117* 118* 125* 129*  CO2 19* 21* 23 24 18*  GLUCOSE 89 108* 104* 111* 83  BUN <5* <5* <5* 5* <5*  CREATININE 0.88 0.97 0.79 0.82 0.70  CALCIUM 8.7* 8.6* 9.1 8.3* 8.4*  MG  --   --  1.9  --   --     CBC: Recent Labs  Lab 06/06/18 0520 06/09/18 1320 06/10/18 1145  WBC 5.0 9.3 8.8  NEUTROABS  --  6.8 6.7  HGB 11.2* 10.4* 10.1*  HCT 34.9* 35.6* 34.3*  MCV 85.3 90.8 90.0  PLT 59* PLATELET CLUMPS NOTED ON SMEAR, UNABLE TO ESTIMATE 103*     Coagulation Studies: No results for input(s):  LABPROT, INR in the last 72 hours.  EEG:   EEG Abnormalities: 1) focal right temporal delta 2) generalized irregular slow activity 3) slow posterior dominant rhythm  This EEG is consistent with an area of focal nonspecific cerebral disturbance in the right temporal region in the setting of a more generalized nonspecific cerebral dysfunction (encephalopathy). There was no seizure or seizure predisposition recorded on this study. Please note that lack of epileptiform activity on EEG does not preclude the possibility of epilepsy.    ASSESSMENT AND PLAN  63 year old female with past history of CVA, right subdural hematoma and epilepsy.  Patient already on 3 medications Keppra, phenobarbital and Vimpat.  Admitted because she had a breakthrough seizure likely in the setting of urinary tract infection.  Today patient seem more somnolent and was having quasi-rhythmic jerking/ twitching right upper extremity.  Improved after receiving Ativan and additional dose of Vimpat.  EEG obtained was not suggestive of epileptiform activity and there was no clinical correlate for right upper extremity movement.  Patient is receiving ceftriaxone for urinary tract infection.  Seizure,  Possible focal partial status   Recommendations Continue current doses of Vimpat, Keppra and phenobarbital Continue seizure precautions Continue antibiotics   We will continue to follow and monitor patient clinically.  Will avoid increasing seizure medications if possible as this would make her more lethargic.     [facility] Pager Number [phone] For questions after  7pm please refer to AMION to reach the Neurologist on call

## 2018-06-11 NOTE — Progress Notes (Signed)
EEG Completed; Results Pending  

## 2018-06-12 ENCOUNTER — Inpatient Hospital Stay (HOSPITAL_COMMUNITY): Payer: Medicare Other

## 2018-06-12 DIAGNOSIS — Z7189 Other specified counseling: Secondary | ICD-10-CM

## 2018-06-12 DIAGNOSIS — Z515 Encounter for palliative care: Secondary | ICD-10-CM

## 2018-06-12 LAB — BASIC METABOLIC PANEL
Anion gap: 9 (ref 5–15)
BUN: <5 mg/dL — ABNORMAL LOW (ref 8–23)
CO2: 21 mmol/L — AB (ref 22–32)
Calcium: 8.7 mg/dL — ABNORMAL LOW (ref 8.9–10.3)
Chloride: 124 mmol/L — ABNORMAL HIGH (ref 98–111)
Creatinine, Ser: 0.8 mg/dL (ref 0.44–1.00)
GFR calc Af Amer: >60 mL/min (ref >60)
GFR calc non Af Amer: >60 mL/min (ref >60)
Glucose, Bld: 115 mg/dL — ABNORMAL HIGH (ref 70–99)
Potassium: 3.9 mmol/L (ref 3.5–5.1)
Sodium: 154 mmol/L — ABNORMAL HIGH (ref 135–145)

## 2018-06-12 LAB — GLUCOSE, CAPILLARY: Glucose-Capillary: 86 mg/dL (ref 70–99)

## 2018-06-12 MED ORDER — PATIROMER SORBITEX CALCIUM 8.4 G PO PACK
16.8000 g | PACK | Freq: Every day | ORAL | Status: DC
  Administered 2018-06-12 – 2018-06-13 (×2): 16.8 g via ORAL
  Filled 2018-06-12 (×3): qty 2

## 2018-06-12 MED ORDER — AMOXICILLIN-POT CLAVULANATE 400-57 MG/5ML PO SUSR
400.0000 mg | Freq: Two times a day (BID) | ORAL | Status: DC
  Administered 2018-06-12 – 2018-06-13 (×4): 400 mg via ORAL
  Filled 2018-06-12 (×6): qty 5

## 2018-06-12 NOTE — Progress Notes (Signed)
Reason for consult: Seizure/ encephalopathy  Subjective: Patient is somnolent but more awake compared to yesterday.  She is following commands and states her name.  No tremors/jerking seen in the right upper extremity.   ROS: Unable to obtain due to poor mental status  Examination  Vital signs in last 24 hours: Temp:  [98.4 F (36.9 C)-99.2 F (37.3 C)] 99 F (37.2 C) (02/12 1214) Pulse Rate:  [60-150] 60 (02/12 1214) Resp:  [16-20] 20 (02/12 1214) BP: (101-158)/(55-88) 101/55 (02/12 1214) SpO2:  [95 %-100 %] 98 % (02/12 1214)  General: lying in bed, slumped over to the left side CVS: pulse-normal rate and rhythm RS: breathing comfortably Extremities: normal   Neuro: MS: Awake, oriented to her name only, follows commands CN: pupils equal and reactive,  EOMI, face symmetric, tongue midline,  Motor: 4/5 strength in right upper extremity and 3/5 strength left upper extremity, 3/5 strength in right lower extremity and 2/5 in LLE, increased tone in the left side compared to the right Reflexes: plantars: flexor Coordination: No gross ataxia Gait: not tested  Basic Metabolic Panel: Recent Labs  Lab 06/07/18 1413 06/09/18 1320 06/10/18 1145 06/11/18 1550 06/12/18 1210  NA 145 150* 156* 155* 154*  K 4.1 4.0 3.5 5.6* 3.9  CL 117* 118* 125* 129* 124*  CO2 21* 23 24 18* 21*  GLUCOSE 108* 104* 111* 83 115*  BUN <5* <5* 5* <5* <5*  CREATININE 0.97 0.79 0.82 0.70 0.80  CALCIUM 8.6* 9.1 8.3* 8.4* 8.7*  MG  --  1.9  --   --   --     CBC: Recent Labs  Lab 06/06/18 0520 06/09/18 1320 06/10/18 1145  WBC 5.0 9.3 8.8  NEUTROABS  --  6.8 6.7  HGB 11.2* 10.4* 10.1*  HCT 34.9* 35.6* 34.3*  MCV 85.3 90.8 90.0  PLT 59* PLATELET CLUMPS NOTED ON SMEAR, UNABLE TO ESTIMATE 103*     Coagulation Studies: No results for input(s): LABPROT, INR in the last 72 hours.  Routine EEG performed 2/11 : This EEG is consistent with an area of focal nonspecific cerebral disturbance in the  right temporal region in the setting of a more generalized nonspecific cerebral dysfunction (encephalopathy). There was no seizure or seizure predisposition recorded on this study. Please note that lack of epileptiform activity on EEG does not preclude the possibility of epilepsy.    ASSESSMENT AND PLAN  63 year old female with past medical history of CVA with residual left-sided weakness, traumatic subdural hemorrhage, vascular dementia, A. fib with pacemaker, chronic diastolic heart failure admitted following a seizure in the setting of urinary tract infection.   Seizures Acute metabolic encephalopathy   Continue Keppra 1500mg  BID Continue Vimpat 200mg  BID Continue Phenobarbital, level today was 21.2 in therapeutic range  Continue Rocephin per medicine team, urine culture showing Proteus Debbe MountsMirabella    [facility] Pager Number [phone] For questions after 7pm please refer to AMION to reach the Neurologist on call

## 2018-06-12 NOTE — Progress Notes (Signed)
[facility] PROGRESS NOTE    Progress Note  Ana Collins  VQX:[id number] DOB: Nov 04, 1955 DOA: 06/09/2018 PCP: Karna Dupes, MD     Brief Narrative:   Ana Collins is an 63 y.o. female past medical history significant for atrial fibrillation, previous stroke, who was on Eliquis until fall which prompted to discontinued the Eliquis as she had a subdural hematoma, recently treated for UTI on 06/07/2018 with Cipro congestive heart failure, seizures due to her strokes on Keppra, phenobarbital and Vimpat comes into the ED for active tonic-clonic seizures, in the ED she was started on IV Keppra and Lorazepam.  Assessment/Plan:   Seizure, late effect of stroke [facility]): Likely worsen from a recent subdural as they have become more frequent. She is currently on Keppra phenobarbital and Vimpat. Neurology was consulted commended to continue antiplatelet therapy and EEG showed no signs of seizures. Continue seizure precautions and antibiotic as recommended by neurology The admitting provider had a long discussion with the patient son as she had  significant brain damage with significant atrophy from strokes with a subdural.  In the setting of recurrent seizures likely due to brain damage.  As per discussion with the son the patient has not spoken in over 2 weeks and has had a significant decline, when she initially moved to [facility] she was able to feed herself and she is not able to do this anymore. Awaiting hospice and palliative care recommendations.  Possible UTI: Urine culture was ordered and showed Proteus Mirabella's with more than 100,000 colonies sensitive to Rocephin. Remained afebrile. Hypernatremia: Her sodium was 156 yesterday, this morning 155.  Increase D5W, recheck basic metabolic panel in the morning.  Hyperkalemia:  Check a 12-lead EKG, repeat a basic metabolic panel. We will give her oral potassium.  Chronic atrial fibrillation: Not on anticoagulation. Rate  controlled.  DVT prophylaxis: SCD Family Communication:Son Disposition Plan/Barrier to D/C: snf after PMT meeting Code Status:     Code Status Orders  (From admission, onward)         Start     Ordered   06/09/18 1635  Full code  Continuous     06/09/18 1637        Code Status History    Date Active Date Inactive Code Status Order ID Comments User Context   06/01/2018 0439 06/08/2018 0138 Full Code [id number]  John Giovanni, MD ED        IV Access:    Peripheral IV   Procedures and diagnostic studies:   No results found.   Medical Consultants:    None.  Anti-Infectives:   [place]hin  Subjective:    Ana Collins nonverbal  Objective:    Vitals:   06/11/18 1145 06/11/18 1556 06/12/18 0609 06/12/18 0747  BP: (!) 158/77 124/69 (!) 158/74 132/88  Pulse: 61 60 63 (!) 150  Resp: 16 20 18 16   Temp: 99.2 F (37.3 C) 98.4 F (36.9 C) 99.2 F (37.3 C) 98.4 F (36.9 C)  TempSrc: Oral Oral Oral Oral  SpO2: 94% 95% 100% 97%  Weight:      Height:        Intake/Output Summary (Last 24 hours) at 06/12/2018 0901 Last data filed at 06/11/2018 1831 Gross per 24 hour  Intake 582.73 ml  Output 600 ml  Net -17.27 ml   Filed Weights   06/09/18 2311  Weight: 84 kg    Exam: General exam: In no acute distress. Respiratory system: Good air movement and clear to auscultation. Cardiovascular system: S1 &  S2 heard, RRR. No JVD.  Gastrointestinal system: Abdomen is nondistended, soft and nontender.  Central nervous system: Somnolent not able to follow commands cannot answer questions pupils are equally round and reactive face is symmetric strength is 4 out of 5 in all 4 extremities.  Withdraws to pain Extremities: No pedal edema. Skin: No rashes, lesions or ulcers    Data Reviewed:    Labs: Basic Metabolic Panel: Recent Labs  Lab 06/06/18 0520 06/07/18 1413 06/09/18 1320 06/10/18 1145 06/11/18 1550  NA 146* 145 150* 156* 155*  K 4.1 4.1 4.0 3.5  5.6*  CL 116* 117* 118* 125* 129*  CO2 19* 21* 23 24 18*  GLUCOSE 89 108* 104* 111* 83  BUN <5* <5* <5* 5* <5*  CREATININE 0.88 0.97 0.79 0.82 0.70  CALCIUM 8.7* 8.6* 9.1 8.3* 8.4*  MG  --   --  1.9  --   --    GFR Estimated Creatinine Clearance: 76 mL/min (by C-G formula based on SCr of 0.7 mg/dL). Liver Function Tests: Recent Labs  Lab 06/09/18 1320  AST 65*  ALT 25  ALKPHOS 136*  BILITOT 0.9  PROT 8.3*  ALBUMIN 2.6*   No results for input(s): LIPASE, AMYLASE in the last 168 hours. No results for input(s): AMMONIA in the last 168 hours. Coagulation profile No results for input(s): INR, PROTIME in the last 168 hours.  CBC: Recent Labs  Lab 06/06/18 0520 06/09/18 1320 06/10/18 1145  WBC 5.0 9.3 8.8  NEUTROABS  --  6.8 6.7  HGB 11.2* 10.4* 10.1*  HCT 34.9* 35.6* 34.3*  MCV 85.3 90.8 90.0  PLT 59* PLATELET CLUMPS NOTED ON SMEAR, UNABLE TO ESTIMATE 103*   Cardiac Enzymes: Recent Labs  Lab 06/09/18 1320  TROPONINI <0.03   BNP (last 3 results) No results for input(s): PROBNP in the last 8760 hours. CBG: Recent Labs  Lab 06/12/18 0749  GLUCAP 86   D-Dimer: No results for input(s): DDIMER in the last 72 hours. Hgb A1c: No results for input(s): HGBA1C in the last 72 hours. Lipid Profile: No results for input(s): CHOL, HDL, LDLCALC, TRIG, CHOLHDL, LDLDIRECT in the last 72 hours. Thyroid function studies: No results for input(s): TSH, T4TOTAL, T3FREE, THYROIDAB in the last 72 hours.  Invalid input(s): FREET3 Anemia work up: No results for input(s): VITAMINB12, FOLATE, FERRITIN, TIBC, IRON, RETICCTPCT in the last 72 hours. Sepsis Labs: Recent Labs  Lab 06/06/18 0520 06/09/18 1320 06/09/18 1821 06/09/18 2230 06/10/18 1145  WBC 5.0 9.3  --   --  8.8  LATICACIDVEN  --   --  1.9 1.9  --    Microbiology Recent Results (from the past 240 hour(s))  Blood culture (routine x 2)     Status: None (Preliminary result)   Collection Time: 06/09/18  1:20 PM   Result Value Ref Range Status   Specimen Description BLOOD LEFT ANTECUBITAL  Final   Special Requests   Final    BOTTLES DRAWN AEROBIC ONLY Blood Culture adequate volume   Culture   Final    NO GROWTH 3 DAYS Performed at [facility], [address] [place], [place] [address]    Report Status PENDING  Incomplete  Urine culture     Status: Abnormal   Collection Time: 06/09/18  2:29 PM  Result Value Ref Range Status   Specimen Description URINE, CLEAN CATCH  Final   Special Requests   Final    NONE Performed at [facility], [address] [place], [place] [address]    Culture >=100,000 COLONIES/mL PROTEUS MIRABILIS (A)  Final   Report Status 06/11/2018 FINAL  Final   Organism ID, Bacteria PROTEUS MIRABILIS (A)  Final      Susceptibility   Proteus mirabilis - MIC*    AMPICILLIN 8 SENSITIVE Sensitive     CEFAZOLIN 8 SENSITIVE Sensitive     CEFTRIAXONE <=1 SENSITIVE Sensitive     CIPROFLOXACIN >=4 RESISTANT Resistant     GENTAMICIN 8 INTERMEDIATE Intermediate     IMIPENEM 2 SENSITIVE Sensitive     NITROFURANTOIN 256 RESISTANT Resistant     TRIMETH/SULFA >=320 RESISTANT Resistant     AMPICILLIN/SULBACTAM 4 SENSITIVE Sensitive     PIP/TAZO <=4 SENSITIVE Sensitive     * >=100,000 COLONIES/mL PROTEUS MIRABILIS  Blood culture (routine x 2)     Status: None (Preliminary result)   Collection Time: 06/09/18  6:10 PM  Result Value Ref Range Status   Specimen Description BLOOD LEFT HAND  Final   Special Requests   Final    AEROBIC BOTTLE ONLY Blood Culture results may not be optimal due to an inadequate volume of blood received in culture bottles   Culture   Final    NO GROWTH 3 DAYS Performed at [facility], [address], [place], [place] [address]    Report Status PENDING  Incomplete     Medications:   . donepezil  10 mg Oral QHS  . lacosamide  200 mg Oral BID  . levETIRAcetam  1,500 mg Oral BID  . LORazepam  0.5 mg Oral BID  . PHENobarbital  64.8 mg  Oral QHS  . senna  1 tablet Oral BID   Continuous Infusions: . cefTRIAXone (ROCEPHIN)  IV 1 g (06/11/18 1555)  . dextrose 75 mL/hr at 06/11/18 2223     LOS: 2 days   Marinda Elk  [facility]  06/12/2018, 9:01 AM

## 2018-06-12 NOTE — Progress Notes (Signed)
  Speech Language Pathology Treatment: Dysphagia  Patient Details Name: Ana Collins MRN: [id number] DOB: [birth year] Today's Date: 06/12/2018 Time: 1130-1140 SLP Time Calculation (min) (ACUTE ONLY): 10 min  Assessment / Plan / Recommendation Clinical Impression  Pt was seen during lunch for dysphagia tx. She was adequately alert throughout the session but required intermittent suctioning for secretion management. She presented with a wet vocal quality at baseline. Despite max cues she did not did demonstrate a volitional swallow with puree and it was ultimately removed from the pt's mouth with oral suctioning. Pt exhibited throat clearing with nectar thick liquids via tsp and coughing following nectar thick liquids via straw. A modified barium swallow study is recommended to further assess swallow function and deterine the least restrictive diet.    HPI HPI: Pt is a 63 year old female with past history of CVA, right subdural hematoma and epilepsy.  Patient already on 3 medications Keppra, phenobarbital and Vimpat. Admitted because she had a breakthrough seizure likely in the setting of urinary tract infection. On 2/11/2 patient seem more somnolent and was having quasi-rhythmic jerking/twitching right upper extremity. An EEG was obtained and was negative for epileptiform activity and there was no clinical correlate for right upper extremity movement. A dysphagia 1 diet with thin liquids was recommended on 06/10/18 but was downgraded to nectar thick liquids by nursing due to observance of signs of aspiration with thin liquids.        SLP Plan  MBS       Recommendations  Diet recommendations: NPO Medication Administration: Via alternative means                Oral Care Recommendations: Oral care BID Follow up Recommendations: Skilled Nursing facility SLP Visit Diagnosis: Dysphagia, oropharyngeal phase (R13.12) Plan: MBS       Iwalani Templeton I. Vear Clock, MS, CCC-SLP Acute Rehabilitation  Services Office number [phone] Pager [phone]               Scheryl Marten 06/12/2018, 1:30 PM

## 2018-06-12 NOTE — Progress Notes (Signed)
Modified Barium Swallow Progress Note  Patient Details  Name: Shanza Moreschi MRN: [id number] Date of Birth: [birth year]  Today's Date: 06/12/2018  Modified Barium Swallow completed.  Full report located under Chart Review in the Imaging Section.  Brief recommendations include the following:  Clinical Impression  Pt presents with moderate oropharyngeal dysphagia characterized by a moderate-severe oral transit delay, impaired bolus control which resulted in premature spillage, and a pharyngeal delay. No penetration/aspiration were noted during the study; however, considering her variable alertness combined with her oropharyngeal delay, her risk of aspiration before deglutition is high with thin liquids. It is therefore recommended that a dysphagia 1 diet with nectar thick liquids be continued and SLP will continue to follow pt.    Swallow Evaluation Recommendations       SLP Diet Recommendations: Dysphagia 1 (Puree) solids;Nectar thick liquid   Liquid Administration via: Cup;Straw   Medication Administration: Whole meds with puree   Supervision: Full assist for feeding   Compensations: Minimize environmental distractions;Slow rate;Small sips/bites   Postural Changes: Remain semi-upright after after feeds/meals (Comment)   Oral Care Recommendations: Oral care BID   Other Recommendations: Have oral suction available  Roselia Snipe I. Vear Clock, MS, CCC-SLP Acute Rehabilitation Services Office number [phone] Pager [phone]  Scheryl Marten 06/12/2018,4:20 PM

## 2018-06-13 ENCOUNTER — Ambulatory Visit (HOSPITAL_COMMUNITY): Payer: Medicare Other

## 2018-06-13 DIAGNOSIS — M7989 Other specified soft tissue disorders: Secondary | ICD-10-CM

## 2018-06-13 LAB — BASIC METABOLIC PANEL
Anion gap: 3 — ABNORMAL LOW (ref 5–15)
BUN: <5 mg/dL — ABNORMAL LOW (ref 8–23)
CO2: 26 mmol/L (ref 22–32)
CREATININE: 0.86 mg/dL (ref 0.44–1.00)
Calcium: 8.8 mg/dL — ABNORMAL LOW (ref 8.9–10.3)
Chloride: 125 mmol/L — ABNORMAL HIGH (ref 98–111)
GFR calc Af Amer: >60 mL/min (ref >60)
GFR calc non Af Amer: >60 mL/min (ref >60)
Glucose, Bld: 115 mg/dL — ABNORMAL HIGH (ref 70–99)
Potassium: 3.5 mmol/L (ref 3.5–5.1)
Sodium: 154 mmol/L — ABNORMAL HIGH (ref 135–145)

## 2018-06-13 LAB — AMMONIA: Ammonia: 51 umol/L — ABNORMAL HIGH (ref 9–35)

## 2018-06-13 MED ORDER — LEVETIRACETAM 100 MG/ML PO SOLN
1000.0000 mg | Freq: Two times a day (BID) | ORAL | Status: DC
  Administered 2018-06-13: 1000 mg via ORAL
  Filled 2018-06-13 (×2): qty 10

## 2018-06-13 NOTE — Care Management Important Message (Signed)
Important Message  Patient Details  Name: Ana Collins MRN: [id number] Date of Birth: 02/28/1956   Medicare Important Message Given:  Yes    Dorena Bodo 06/13/2018, 2:54 PM

## 2018-06-13 NOTE — Progress Notes (Addendum)
NEUROLOGY PROGRESS NOTE  Subjective: Patient is laying in bed sleeping initially.  Awakes with noxious stimuli but only groans.  She does lift her arms to noxious stimuli but she does not localize.  Exam: Vitals:   06/13/18 0431 06/13/18 0733  BP: (!) 142/74 (!) 128/53  Pulse: 63 60  Resp: 20 20  Temp: 99 F (37.2 C) 98.9 F (37.2 C)  SpO2: 96% 93%    Physical Exam   HEENT-  Normocephalic, no lesions, without obvious abnormality.  Normal external eye and conjunctiva.  Extremities- Warm, dry and intact Musculoskeletal-no joint tenderness, deformity or swelling Skin-warm and dry, no hyperpigmentation, vitiligo, or suspicious lesions    Neuro:  Mental Status: Patient remains encephalopathic.  Does not answer any questions.  To noxious stimuli she will withdrawal but does not localize.  Moans to noxious stimuli.  Clenches eyes shut. Cranial Nerves: II: Blinks to threat bilaterally III,IV, VI: Doll's intact bilaterally.  At rest she has disconjugate gaze and pupils equal, round, reactive to light and accommodation V,VII: Patient's lower lip is pain to the left however head is turned to the left facial light touch sensation normal bilaterally VIII: No real response to verbal commands Motor: Being extremities to antigravity only to noxious stimuli Sensory: Withdraws to noxious stimuli Plantars: Upgoing bilaterally   Medications:  Prior to Admission:  Medications Prior to Admission  Medication Sig Dispense Refill Last Dose  . acetaminophen (TYLENOL) 325 MG tablet Take 650 mg by mouth 2 (two) times daily.    06/08/2018 at Unknown time  . B Complex-C (B-COMPLEX WITH VITAMIN C) tablet Take 1 tablet by mouth daily.   06/08/2018 at Unknown time  . carboxymethylcellul-glycerin (REFRESH OPTIVE) 0.5-0.9 % ophthalmic solution Place 1 drop into both eyes 4 (four) times daily.   06/08/2018 at Unknown time  . cloNIDine (CATAPRES) 0.1 MG tablet Take 0.1 mg by mouth 3 (three) times daily.    06/08/2018 at Unknown time  . donepezil (ARICEPT) 10 MG tablet Take 10 mg by mouth at bedtime.   Past Week at Unknown time  . Fe Fum-FA-B Cmp-C-Zn-Mg-Mn-Cu (HEMOCYTE-PLUS PO) Take 1 capsule by mouth 2 (two) times daily.   06/08/2018 at Unknown time  . furosemide (LASIX) 20 MG tablet Take 60 mg by mouth daily.    06/08/2018 at Unknown time  . lacosamide 100 MG TABS Take 1 tablet (100 mg total) by mouth 2 (two) times daily. 60 tablet 1 Past Week at Unknown time  . levETIRAcetam (KEPPRA) 100 MG/ML solution Take 15 mLs by mouth 2 (two) times daily.    06/08/2018 at Unknown time  . LORazepam (ATIVAN) 0.5 MG tablet Take 1 tablet (0.5 mg total) by mouth 2 (two) times daily. 10 tablet 0 06/08/2018 at Unknown time  . losartan (COZAAR) 25 MG tablet Take 25 mg by mouth daily.    06/08/2018 at Unknown time  . Multiple Vitamin (MULTIVITAMIN WITH MINERALS) TABS tablet Take 1 tablet by mouth daily.   06/08/2018 at Unknown time  . Nutritional Supplements (RESOURCE 2.0 PO) Take 1 Can by mouth 3 (three) times daily between meals.   06/08/2018 at Unknown time  . omeprazole (PRILOSEC) 20 MG capsule Take 20 mg by mouth daily.   06/08/2018 at Unknown time  . ondansetron (ZOFRAN) 8 MG tablet Take by mouth every 8 (eight) hours as needed for nausea or vomiting.   06/08/2018 at Unknown time  . PHENobarbital (LUMINAL) 64.8 MG tablet Take 64.8 mg by mouth at bedtime.   06/08/2018 at  Unknown time  . potassium chloride (K-DUR) 10 MEQ tablet Take 20 mEq by mouth daily.   06/08/2018 at Unknown time  . pravastatin (PRAVACHOL) 80 MG tablet Take 80 mg by mouth daily.   06/08/2018 at Unknown time  . sertraline (ZOLOFT) 50 MG tablet Take 50 mg by mouth daily.   06/08/2018 at Unknown time  . sucralfate (CARAFATE) 1 g tablet Take 1 g by mouth 3 (three) times daily as needed Genella Rife).    unk  . Umeclidinium-Vilanterol (ANORO ELLIPTA IN) Inhale 1 puff into the lungs daily.   06/08/2018 at Unknown time   Scheduled: . amoxicillin-clavulanate  400 mg Oral Q12H  .  donepezil  10 mg Oral QHS  . lacosamide  200 mg Oral BID  . levETIRAcetam  1,500 mg Oral BID  . LORazepam  0.5 mg Oral BID  . patiromer  16.8 g Oral Daily  . PHENobarbital  64.8 mg Oral QHS  . senna  1 tablet Oral BID   Continuous: . dextrose 100 mL/hr at 06/13/18 0701    Pertinent Labs/Diagnostics: -Sodium 154 -Chloride 124 -21  Routine EEG performed 2/11 : ThisEEG is consistent with anarea of focal nonspecific cerebral disturbance in the right temporal region in the setting of a more generalized nonspecific cerebral dysfunction (encephalopathy). There was no seizure or seizure predisposition recorded on this study. Please note that lack of epileptiform activity on EEG does not preclude the possibility of epilepsy.    Felicie Morn PA-C [facility] [phone]   Assessment: 63 year old female with past medical history of CVA with residual left-sided weakness, traumatic subdural hemorrhage, vascular dementia, A. fib with pacemaker, chronic diastolic heart failure admitted following a seizure in the setting of urinary tract infection.  No further seizures while she has been in-house.  She remains encephalopathic, worse today compared to yesterday.  Also noted to have upper extremity DVT.  We will reduce her seizure medication as she has been seizure-free for atleast 2 days.   Impression:  -Seizure -Encephalopathy -UTI - Left upper limb DVT  Recommendations: -Will reduce Keppra to 1g BID, Vimpat 200 mg twice daily, phenobarbital -Continue Rocephin per medicine team - Unfortunately, due to SDH unable to anticoagulate for DVT    06/13/2018, 9:43 AM  NEUROHOSPITALIST ADDENDUM Performed a face to face diagnostic evaluation.   I have reviewed the contents of history and physical exam as documented by PA/ARNP/Resident and agree with above documentation.  I have discussed and formulated the above plan as documented. Edits to the note have been made as  needed.   The patient is in between a rock and a hard place, she is a high risk for seizures, but at the same time appears to be severely encephalopathic seizure regimen.  We will reduce her Keppra today and that she may be at increased risk for seizures.  To make matters worse she has a upper limb DVT now-but given her recent subdural hemorrhage I do not think it is safe to start anticoagulation yet.       Georgiana Spinner Alexa Blish MD [facility] [phone]   If 7pm to 7am, please call on call as listed on AMION.

## 2018-06-13 NOTE — Progress Notes (Signed)
Bilateral upper extremity venous duplex completed. Refer to "CV Proc" under chart review to view preliminary results.  06/13/2018 11:29 AM Gertie Fey, MHA, RVT, RDCS, RDMS

## 2018-06-13 NOTE — Progress Notes (Signed)
Palliative care brief note  Met today with patient son and daughter in law.    We had initial palliative care conversation, and her son reports needing time to process information.  Continue current care.  Recommend palliative care to follow on discharge.  Full note to follow.  Micheline Rough, MD [place] Team [phone]

## 2018-06-13 NOTE — Progress Notes (Addendum)
[facility] PROGRESS NOTE    Progress Note  Ana Collins  ZOX:[id number] DOB: 01-07-1956 DOA: 06/09/2018 PCP: Ana Dupes, MD     Brief Narrative:   Ana Collins is an 63 y.o. female past medical history significant for atrial fibrillation, previous stroke, who was on Eliquis until fall which prompted to discontinued the Eliquis as she had a subdural hematoma, recently treated for UTI on 06/07/2018 with Cipro congestive heart failure, seizures due to her strokes on Keppra, phenobarbital and Vimpat comes into the ED for active tonic-clonic seizures, in the ED she was started on IV Keppra and Lorazepam.  Assessment/Plan:   Seizure, late effect of stroke [facility]): She is currently on Keppra phenobarbital and Vimpat. Neurology was consulted recommended to decrease the Keppra, discontinue Ativan and to continue observe. Continue seizure precautions and antibiotic as recommended by neurology Following evaluation show moderate risk for aspiration.  Possible UTI: Urine culture was ordered and showed Proteus  with more than 100,000 colonies changes to liquid Augmentin.  Hypernatremia: Continue D5W sodium is improving very slowly.  Hyperkalemia:  EKG showed no peaked T waves potassium improved today.  Chronic atrial fibrillation: Not on anticoagulation. Rate controlled.  Left upper extremity swelling: Duplex ultrasound+ upper extremity DVT, discussed the case with neurosurgery he recommended against anticoagulation due to the high risk of rebleeding into her subdural hematoma.  DVT prophylaxis: SCD Family Communication:Son Disposition Plan/Barrier to D/C: snf when medically stable. Code Status:     Code Status Orders  (From admission, onward)         Start     Ordered   06/09/18 1635  Full code  Continuous     06/09/18 1637        Code Status History    Date Active Date Inactive Code Status Order ID Comments User Context   06/01/2018 0439 06/08/2018 0138 Full Code  [id number]  Ana Giovanni, MD ED        IV Access:    Peripheral IV   Procedures and diagnostic studies:   Dg Swallowing Func-speech Pathology  Result Date: 06/12/2018 Objective Swallowing Evaluation: Type of Study: MBS-Modified Barium Swallow Study  Patient Details Name: Ana Collins MRN: [id number] Date of Birth: [birth year] Today's Date: 06/12/2018 Time: SLP Start Time (ACUTE ONLY): 1425 -SLP Stop Time (ACUTE ONLY): 1446 SLP Time Calculation (min) (ACUTE ONLY): 21 min Past Medical History: Past Medical History: Diagnosis Date . Anemia  . Atrial fibrillation (HCC)  . CHF (congestive heart failure) (HCC)   diastolic . CVD (cardiovascular disease)  . Dementia (HCC)   vascular . Epilepsy (HCC)  . GERD (gastroesophageal reflux disease)  . Heart disease   chronic ischemic . Hyperlipidemia  . Hypertension  . Pacemaker  . Traumatic subdural hemorrhage (HCC)   w/o LOC Past Surgical History: No past surgical history on file. HPI: Pt is a 63 year old female with past history of CVA, right subdural hematoma and epilepsy.  Patient already on 3 medications Keppra, phenobarbital and Vimpat. Admitted because she had a breakthrough seizure likely in the setting of urinary tract infection. On 2/11/2 patient seem more somnolent and was having quasi-rhythmic jerking/twitching right upper extremity. An EEG was obtained and was negative for epileptiform activity and there was no clinical correlate for right upper extremity movement. A dysphagia 1 diet with thin liquids was recommended on 06/10/18 but was downgraded to nectar thick liquids by nursing due to observance of signs of aspiration with thin liquids.   Subjective: pt awake in bed. daughter in law  present Assessment / Plan / Recommendation CHL IP CLINICAL IMPRESSIONS 06/12/2018 Clinical Impression Pt presents with moderate oropharyngeal dysphagia characterized by a moderate-severe oral transit delay, impaired bolus control which resulted in premature spillage, and  a pharyngeal delay. No penetration/aspiration were noted during the study; however, considering her variable alertness combined with her oropharyngeal delay, her risk of aspiration before deglutition is high with thin liquids. It is therefore recommended that a dysphagia 1 diet with nectar thick liquids be continued and SLP will continue to follow pt.  SLP Visit Diagnosis Dysphagia, oropharyngeal phase (R13.12) Attention and concentration deficit following -- Frontal lobe and executive function deficit following -- Impact on safety and function Mild aspiration risk;Moderate aspiration risk   CHL IP TREATMENT RECOMMENDATION 06/12/2018 Treatment Recommendations Therapy as outlined in treatment plan below   Prognosis 06/12/2018 Prognosis for Safe Diet Advancement Fair Barriers to Reach Goals Cognitive deficits;Severity of deficits Barriers/Prognosis Comment -- [facility] IP DIET RECOMMENDATION 06/12/2018 SLP Diet Recommendations Dysphagia 1 (Puree) solids;Nectar thick liquid Liquid Administration via Cup;Straw Medication Administration Whole meds with puree Compensations Minimize environmental distractions;Slow rate;Small sips/bites Postural Changes Remain semi-upright after after feeds/meals (Comment)   CHL IP OTHER RECOMMENDATIONS 06/12/2018 Recommended Consults -- Oral Care Recommendations Oral care BID Other Recommendations Have oral suction available   [facility] IP FOLLOW UP RECOMMENDATIONS 06/12/2018 Follow up Recommendations Skilled Nursing facility   [facility] IP FREQUENCY AND DURATION 06/12/2018 Speech Therapy Frequency (ACUTE ONLY) min 2x/week Treatment Duration 2 weeks      CHL IP ORAL PHASE 06/12/2018 Oral Phase Impaired Oral - Pudding Teaspoon -- Oral - Pudding Cup -- Oral - Honey Teaspoon -- Oral - Honey Cup -- Oral - Nectar Teaspoon -- Oral - Nectar Cup -- Oral - Nectar Straw Delayed oral transit;Premature spillage Oral - Thin Teaspoon -- Oral - Thin Cup Delayed oral transit;Premature spillage Oral - Thin Straw -- Oral - Puree  Delayed oral transit;Premature spillage Oral - Mech Soft Impaired mastication;Delayed oral transit;Premature spillage Oral - Regular -- Oral - Multi-Consistency -- Oral - Pill -- Oral Phase - Comment --  CHL IP PHARYNGEAL PHASE 06/12/2018 Pharyngeal Phase Impaired Pharyngeal- Pudding Teaspoon -- Pharyngeal -- Pharyngeal- Pudding Cup -- Pharyngeal -- Pharyngeal- Honey Teaspoon -- Pharyngeal -- Pharyngeal- Honey Cup -- Pharyngeal -- Pharyngeal- Nectar Teaspoon -- Pharyngeal -- Pharyngeal- Nectar Cup -- Pharyngeal -- Pharyngeal- Nectar Straw Delayed swallow initiation-vallecula Pharyngeal -- Pharyngeal- Thin Teaspoon -- Pharyngeal -- Pharyngeal- Thin Cup -- Pharyngeal -- Pharyngeal- Thin Straw Delayed swallow initiation-pyriform sinuses Pharyngeal -- Pharyngeal- Puree Delayed swallow initiation-vallecula Pharyngeal -- Pharyngeal- Mechanical Soft Delayed swallow initiation-vallecula Pharyngeal -- Pharyngeal- Regular -- Pharyngeal -- Pharyngeal- Multi-consistency -- Pharyngeal -- Pharyngeal- Pill -- Pharyngeal -- Pharyngeal Comment --  CHL IP CERVICAL ESOPHAGEAL PHASE 06/12/2018 Cervical Esophageal Phase WFL Pudding Teaspoon -- Pudding Cup -- Honey Teaspoon -- Honey Cup -- Nectar Teaspoon -- Nectar Cup -- Nectar Straw -- Thin Teaspoon -- Thin Cup -- Thin Straw -- Puree -- Mechanical Soft -- Regular -- Multi-consistency -- Pill -- Cervical Esophageal Comment -- Shanika I. Vear Clock, MS, CCC-SLP Acute Rehabilitation Services Office number [phone] Pager [phone] Scheryl Marten 06/12/2018, 4:22 PM                Medical Consultants:    None.  Anti-Infectives:   [place]  Subjective:    [name] nonverbal, lethargic today.  Objective:    Vitals:   06/12/18 1953 06/12/18 2357 06/13/18 0431 06/13/18 0733  BP: 98/61 132/60 (!) 142/74 (!) 128/53  Pulse: 67 66  63 60  Resp: 18 16 20 20   Temp: 99.3 F (37.4 C) 98.9 F (37.2 C) 99 F (37.2 C) 98.9 F (37.2 C)  TempSrc: Axillary Axillary  Axillary Axillary  SpO2: 92% 96% 96% 93%  Weight:      Height:        Intake/Output Summary (Last 24 hours) at 06/13/2018 1013 Last data filed at 06/13/2018 0900 Gross per 24 hour  Intake 2076.86 ml  Output -  Net 2076.86 ml   Filed Weights   06/09/18 2311  Weight: 84 kg    Exam: General exam: In no acute distress. Respiratory system: Good air movement and clear to auscultation. Cardiovascular system: S1 & S2 heard, RRR. No JVD.  Gastrointestinal system: Abdomen is nondistended, soft and nontender.  Central nervous system: Somnolent not able to follow commands cannot answer questions pupils are equally round and reactive face is symmetric strength is 4 out of 5 in all 4 extremities.  Withdraws to pain Extremities: No pedal edema. Skin: No rashes, lesions or ulcers    Data Reviewed:    Labs: Basic Metabolic Panel: Recent Labs  Lab 06/07/18 1413 06/09/18 1320 06/10/18 1145 06/11/18 1550 06/12/18 1210  NA 145 150* 156* 155* 154*  K 4.1 4.0 3.5 5.6* 3.9  CL 117* 118* 125* 129* 124*  CO2 21* 23 24 18* 21*  GLUCOSE 108* 104* 111* 83 115*  BUN <5* <5* 5* <5* <5*  CREATININE 0.97 0.79 0.82 0.70 0.80  CALCIUM 8.6* 9.1 8.3* 8.4* 8.7*  MG  --  1.9  --   --   --    GFR Estimated Creatinine Clearance: 76 mL/min (by C-G formula based on SCr of 0.8 mg/dL). Liver Function Tests: Recent Labs  Lab 06/09/18 1320  AST 65*  ALT 25  ALKPHOS 136*  BILITOT 0.9  PROT 8.3*  ALBUMIN 2.6*   No results for input(s): LIPASE, AMYLASE in the last 168 hours. No results for input(s): AMMONIA in the last 168 hours. Coagulation profile No results for input(s): INR, PROTIME in the last 168 hours.  CBC: Recent Labs  Lab 06/09/18 1320 06/10/18 1145  WBC 9.3 8.8  NEUTROABS 6.8 6.7  HGB 10.4* 10.1*  HCT 35.6* 34.3*  MCV 90.8 90.0  PLT PLATELET CLUMPS NOTED ON SMEAR, UNABLE TO ESTIMATE 103*   Cardiac Enzymes: Recent Labs  Lab 06/09/18 1320  TROPONINI <0.03   BNP (last 3  results) No results for input(s): PROBNP in the last 8760 hours. CBG: Recent Labs  Lab 06/12/18 0749  GLUCAP 86   D-Dimer: No results for input(s): DDIMER in the last 72 hours. Hgb A1c: No results for input(s): HGBA1C in the last 72 hours. Lipid Profile: No results for input(s): CHOL, HDL, LDLCALC, TRIG, CHOLHDL, LDLDIRECT in the last 72 hours. Thyroid function studies: No results for input(s): TSH, T4TOTAL, T3FREE, THYROIDAB in the last 72 hours.  Invalid input(s): FREET3 Anemia work up: No results for input(s): VITAMINB12, FOLATE, FERRITIN, TIBC, IRON, RETICCTPCT in the last 72 hours. Sepsis Labs: Recent Labs  Lab 06/09/18 1320 06/09/18 1821 06/09/18 2230 06/10/18 1145  WBC 9.3  --   --  8.8  LATICACIDVEN  --  1.9 1.9  --    Microbiology Recent Results (from the past 240 hour(s))  Blood culture (routine x 2)     Status: None (Preliminary result)   Collection Time: 06/09/18  1:20 PM  Result Value Ref Range Status   Specimen Description BLOOD LEFT ANTECUBITAL  Final   Special  Requests   Final    BOTTLES DRAWN AEROBIC ONLY Blood Culture adequate volume   Culture   Final    NO GROWTH 4 DAYS Performed at [facility], [address] [place], [place] [address]    Report Status PENDING  Incomplete  Urine culture     Status: Abnormal   Collection Time: 06/09/18  2:29 PM  Result Value Ref Range Status   Specimen Description URINE, CLEAN CATCH  Final   Special Requests   Final    NONE Performed at [facility], [address] [place], [place] [address]    Culture >=100,000 COLONIES/mL PROTEUS MIRABILIS (A)  Final   Report Status 06/11/2018 FINAL  Final   Organism ID, Bacteria PROTEUS MIRABILIS (A)  Final      Susceptibility   Proteus mirabilis - MIC*    AMPICILLIN 8 SENSITIVE Sensitive     CEFAZOLIN 8 SENSITIVE Sensitive     CEFTRIAXONE <=1 SENSITIVE Sensitive     CIPROFLOXACIN >=4 RESISTANT Resistant     GENTAMICIN 8 INTERMEDIATE Intermediate      IMIPENEM 2 SENSITIVE Sensitive     NITROFURANTOIN 256 RESISTANT Resistant     TRIMETH/SULFA >=320 RESISTANT Resistant     AMPICILLIN/SULBACTAM 4 SENSITIVE Sensitive     PIP/TAZO <=4 SENSITIVE Sensitive     * >=100,000 COLONIES/mL PROTEUS MIRABILIS  Blood culture (routine x 2)     Status: None (Preliminary result)   Collection Time: 06/09/18  6:10 PM  Result Value Ref Range Status   Specimen Description BLOOD LEFT HAND  Final   Special Requests   Final    AEROBIC BOTTLE ONLY Blood Culture results may not be optimal due to an inadequate volume of blood received in culture bottles   Culture   Final    NO GROWTH 4 DAYS Performed at [facility], [address] [place], [place] [address]    Report Status PENDING  Incomplete     Medications:   . amoxicillin-clavulanate  400 mg Oral Q12H  . donepezil  10 mg Oral QHS  . lacosamide  200 mg Oral BID  . levETIRAcetam  1,000 mg Oral BID  . patiromer  16.8 g Oral Daily  . PHENobarbital  64.8 mg Oral QHS  . senna  1 tablet Oral BID   Continuous Infusions: . dextrose 100 mL/hr at 06/13/18 0701     LOS: 3 days   Marinda Elkbraham Feliz Ortiz  [facility]  06/13/2018, 10:13 AM

## 2018-06-13 NOTE — Progress Notes (Addendum)
Daily Progress Note   Patient Name: Ana Collins       Date: 06/13/2018 DOB: 06/04/1955  Age: 63 y.o. MRN#: [id number] Attending Physician: Charlynne Cousins, MD Primary Care Physician: Raymondo Band, MD Admit Date: 06/09/2018  Reason for Consultation/Follow-up: Establishing goals of care  Subjective: Met with son in hall and discussed in conjunction with SLP.  We reviewed plan to decrease antiepileptic to see if improvement in encephalopathy.  Length of Stay: 3  Current Medications: Scheduled Meds:  . amoxicillin-clavulanate  400 mg Oral Q12H  . donepezil  10 mg Oral QHS  . lacosamide  200 mg Oral BID  . levETIRAcetam  1,000 mg Oral BID  . patiromer  16.8 g Oral Daily  . PHENobarbital  64.8 mg Oral QHS  . senna  1 tablet Oral BID    Continuous Infusions: . dextrose 100 mL/hr at 06/13/18 0701    PRN Meds: acetaminophen **OR** acetaminophen, LORazepam, sodium chloride flush  Physical Exam         General: Opens eyes but otherwise does not engage Heart: Regular rate and rhythm. No murmur appreciated. Lungs: Fair air movement, clear Abdomen: Soft, nontender, nondistended, positive bowel sounds.  Ext: No significant edema Skin: Warm and dry  Vital Signs: BP (!) 155/83 (BP Location: Left Leg)   Pulse 62   Temp 99.5 F (37.5 C) (Oral)   Resp 18   Ht '5\' 5"'$  (1.651 m)   Wt 84 kg   SpO2 100%   BMI 30.82 kg/m  SpO2: SpO2: 100 % O2 Device: O2 Device: Nasal Cannula O2 Flow Rate: O2 Flow Rate (L/min): 2 L/min  Intake/output summary:   Intake/Output Summary (Last 24 hours) at 06/13/2018 2216 Last data filed at 06/13/2018 1300 Gross per 24 hour  Intake 1889.46 ml  Output -  Net 1889.46 ml   LBM: Last BM Date: 06/12/18 Baseline Weight: Weight: 84 kg Most recent  weight: Weight: 84 kg       Palliative Assessment/Data:    Flowsheet Rows     Most Recent Value  Intake Tab  Referral Department  Hospitalist  Unit at Time of Referral  Med/Surg Unit  Palliative Care Primary Diagnosis  Neurology  Date Notified  06/10/18  Palliative Care Type  New Palliative care  Reason for referral  Clarify Goals of Care  Date of Admission  06/09/18  Date first seen by Palliative Care  06/12/18  # of days Palliative referral response time  2 Day(s)  # of days IP prior to Palliative referral  1  Clinical Assessment  Psychosocial & Spiritual Assessment  Palliative Care Outcomes      Patient Active Problem List   Diagnosis Date Noted  . Seizure as late effect of cerebrovascular accident (CVA) ([place]) 06/10/2018  . Abnormal urinalysis 06/09/2018  . Seizure ([place]) 06/09/2018  . Atrial fibrillation, chronic 06/09/2018  . Pacemaker 06/09/2018  . Seizure, late effect of stroke ([place]) 06/09/2018  . Fever 06/09/2018  . Chronic subdural hematoma ([place]) 06/01/2018  . UTI (urinary tract infection) 06/01/2018  . Hypernatremia 06/01/2018  . Acute encephalopathy 06/01/2018  . Hypotension 06/01/2018  . AKI (acute kidney injury) ([place]) 06/01/2018    Palliative Care Assessment & Plan   Patient Profile: 63 y.o. female  with past medical history of A. fib, previous stroke on Eliquis and to a fall with resultant subdural hematoma, CHF, UTI, hyponatremia, seizures due to stroke on Keppra phenobarbital and Vimpat admitted on 06/09/2018 with UTI, decreased mental and functional status, and seizure activity.    Recommendations/Plan:  Continue full scope care  Her son remains overwhelmed.  He is not at a point where he is ready to make any decisions regarding her long-term care goals.  I recommend palliative care to follow at skilled nursing facility to advance conversation as her clinical course becomes more clear and her son is emotionally able to process.  If you agree with  palliative care to follow at skilled facility, please include this on the discharge summary.  Goals of Care and Additional Recommendations:  Limitations on Scope of Treatment: Full Scope Treatment  Code Status:    Code Status Orders  (From admission, onward)         Start     Ordered   06/09/18 1635  Full code  Continuous     06/09/18 1637        Code Status History    Date Active Date Inactive Code Status Order ID Comments User Context   06/01/2018 0439 06/08/2018 0138 Full Code [id number]  Shela Leff, MD ED       Prognosis:   Unable to determine  Discharge Planning:  To Be Determined  Care plan was discussed with son, SLP  Thank you for allowing the Palliative Medicine Team to assist in the care of this patient.   Total Time 45 Prolonged Time Billed No      Greater than 50%  of this time was spent counseling and coordinating care related to the above assessment and plan.  Micheline Rough, MD  Please contact Palliative Medicine Team phone at [phone] for questions and concerns.

## 2018-06-13 NOTE — Progress Notes (Signed)
SLP Cancellation Note  Patient Details Name: Ana Collins MRN: [id number] DOB: 1955-11-06   Cancelled treatment:       Reason Eval/Treat Not Completed: Fatigue/lethargy limiting ability to participate. Pt was approached for treatment once more but was unable to maintain alertness well enough to participate. SLP will follow up.    Scheryl Marten 06/13/2018, 4:52 PM

## 2018-06-13 NOTE — Consult Note (Signed)
Consultation Note Date: 06/13/2018   Patient Name: Ana Collins  DOB: 22-Jan-1956  MRN: [id number]  Age / Sex: 63 y.o., female  PCP: Raymondo Band, MD Referring Physician: Aileen Fass, Tammi Klippel, MD  Reason for Consultation: Establishing goals of care  HPI/Patient Profile: 63 y.o. female  with past medical history of A. 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   Clinical Assessment and Goals of Care: I met today with patient's son and daughter-in-law.   I introduced palliative care as specialized medical care for people living with serious illness. It focuses on providing relief from the symptoms and stress of a serious illness. The goal is to improve quality of life for both the patient and the family.  We discussed clinical course as well as wishes moving forward during hospitalization and after discharge.  We discussed advance directives.  Concepts specific to code status and rehospitalization discussed.  We discussed difference between a aggressive medical intervention path and a palliative, comfort focused care path.  Values and goals of care important to patient and family were attempted to be elicited.  Concept of Hospice and Palliative Care were discussed  Questions and concerns addressed.   PMT will continue to support holistically.  SUMMARY OF RECOMMENDATIONS   -Full Code/full scope -Her son reports that he has not really considered long-term care goals for his mother and he needs time to process this.  We had a long discussion regarding the fact that she has had significant decrease in her nutrition, cognition, and functional status and that these are signs of likely disease  progression.  He states that he is hopeful that there can be a regimen for her seizures where she is still able to be awake enough to maintain her nutrition and hydration.  We discussed option of palliative care versus hospice following when she returns to long-term care facility as well as the extra layer of support that hospice would provide for her versus palliative care. -I gave him copies of a most form as well as hard choices for loving People to review. -While her son was very appreciative of information, he is not at a point where he is ready to make any decisions regarding her long-term care goals.  I recommend palliative care to follow at skilled nursing facility to advance conversation as her clinical course becomes more clear and her son is emotionally able to process.  If you agree with palliative care to follow at skilled facility, please include this on the discharge summary.  Code Status/Advance Care Planning:  Full code   Symptom Management:  Seizures-Per neurology.    Palliative Prophylaxis:   Frequent Pain Assessment  Additional Recommendations (Limitations, Scope, Preferences):  Full Scope Treatment  Psycho-social/Spiritual:   Desire for further Chaplaincy support:no  Additional Recommendations: Education on Hospice  Prognosis:   Unable to determine  Discharge Planning: Skilled facility with palliative      Primary Diagnoses: Present on Admission: . Chronic subdural hematoma (HCC) . Abnormal urinalysis . Hypernatremia . Atrial fibrillation, chronic . Pacemaker . Fever   I have reviewed the medical record, interviewed the patient and family, and examined the patient. The following aspects are pertinent.  Past Medical History:  Diagnosis Date  . Anemia   . Atrial fibrillation ([place])   . CHF (congestive heart failure) (HCC)    diastolic  . CVD (cardiovascular disease)   . Dementia ([place])    vascular  . Epilepsy ([place])   . GERD (gastroesophageal  reflux disease)   . Heart disease    chronic ischemic  . Hyperlipidemia   . Hypertension   . Pacemaker   . Traumatic subdural hemorrhage (HCC)    w/o LOC   Social History   Socioeconomic History  . Marital status: Single    Spouse name: Not on file  . Number of children: 1  . Years of education: 19  . Highest education level: Not on file  Occupational History  . Not on file  Social Needs  . Financial resource strain: Not on file  . Food insecurity:    Worry: Not on file    Inability: Not on file  . Transportation needs:    Medical: Not on file    Non-medical: Not on file  Tobacco Use  . Smoking status: Never Smoker  . Smokeless tobacco: Never Used  Substance and Sexual Activity  . Alcohol use: Never    Frequency: Never  . Drug use: Never  . Sexual activity: Not on file  Lifestyle  . Physical activity:    Days per week: Not on file    Minutes per session: Not on file  . Stress: Not on file  Relationships  . Social connections:    Talks on phone: Not on file    Gets together: Not on file    Attends religious service: Not on file    Active member of club or organization: Not on file    Attends meetings of clubs or organizations: Not on file    Relationship status: Not on file  Other Topics Concern  . Not on file  Social History Narrative   04/10/18 residing at [facility] History  Problem Relation Age of Onset  . CVA Mother    Scheduled Meds: . amoxicillin-clavulanate  400 mg Oral Q12H  . donepezil  10 mg Oral QHS  . lacosamide  200 mg Oral BID  . levETIRAcetam  1,500 mg Oral BID  . LORazepam  0.5 mg Oral BID  . patiromer  16.8 g Oral Daily  . PHENobarbital  64.8 mg Oral QHS  . senna  1 tablet Oral BID   Continuous Infusions: . dextrose 100 mL/hr at 06/13/18 0701   PRN Meds:.acetaminophen **OR** acetaminophen, LORazepam, sodium chloride flush Medications Prior to Admission:  Prior to Admission medications   Medication Sig Start Date  End Date Taking? Authorizing Provider  acetaminophen (TYLENOL) 325 MG tablet Take 650 mg by mouth 2 (two) times daily.    Yes [provider]  B Complex-C (B-COMPLEX WITH VITAMIN C) tablet Take 1 tablet by mouth daily.   Yes [provider]  carboxymethylcellul-glycerin (REFRESH OPTIVE) 0.5-0.9 % ophthalmic solution Place 1 drop into both eyes 4 (four) times daily.   Yes [provider]  cloNIDine (CATAPRES) 0.1 MG tablet Take 0.1 mg by mouth 3 (three) times daily.  Yes [provider]  donepezil (ARICEPT) 10 MG tablet Take 10 mg by mouth at bedtime.   Yes [provider]  Fe Fum-FA-B Cmp-C-Zn-Mg-Mn-Cu (HEMOCYTE-PLUS PO) Take 1 capsule by mouth 2 (two) times daily.   Yes [provider]  furosemide (LASIX) 20 MG tablet Take 60 mg by mouth daily.    Yes [provider]  lacosamide 100 MG TABS Take 1 tablet (100 mg total) by mouth 2 (two) times daily. 06/07/18  Yes Annita Brod, MD  levETIRAcetam (KEPPRA) 100 MG/ML solution Take 15 mLs by mouth 2 (two) times daily.    Yes [provider]  LORazepam (ATIVAN) 0.5 MG tablet Take 1 tablet (0.5 mg total) by mouth 2 (two) times daily. 06/07/18  Yes Annita Brod, MD  losartan (COZAAR) 25 MG tablet Take 25 mg by mouth daily.    Yes [provider]  Multiple Vitamin (MULTIVITAMIN WITH MINERALS) TABS tablet Take 1 tablet by mouth daily.   Yes [provider]  Nutritional Supplements (RESOURCE 2.0 PO) Take 1 Can by mouth 3 (three) times daily between meals.   Yes [provider]  omeprazole (PRILOSEC) 20 MG capsule Take 20 mg by mouth daily.   Yes [provider]  ondansetron (ZOFRAN) 8 MG tablet Take by mouth every 8 (eight) hours as needed for nausea or vomiting.   Yes [provider]  PHENobarbital (LUMINAL) 64.8 MG tablet Take 64.8 mg by mouth at bedtime.   Yes [provider]  potassium chloride (K-DUR) 10 MEQ tablet Take 20  mEq by mouth daily.   Yes [provider]  pravastatin (PRAVACHOL) 80 MG tablet Take 80 mg by mouth daily.   Yes [provider]  sertraline (ZOLOFT) 50 MG tablet Take 50 mg by mouth daily.   Yes [provider]  sucralfate (CARAFATE) 1 g tablet Take 1 g by mouth 3 (three) times daily as needed Jerrye Bushy).    Yes [provider]  Umeclidinium-Vilanterol (ANORO ELLIPTA IN) Inhale 1 puff into the lungs daily.   Yes [provider]   Allergies  Allergen Reactions  . Latex Other (See Comments)    ON MAR  . Penicillins Other (See Comments)    Did it involve swelling of the face/tongue/throat, SOB, or low BP? Unknown Did it involve sudden or severe rash/hives, skin peeling, or any reaction on the inside of your mouth or nose? Unknown Did you need to seek medical attention at a hospital or doctor's office? Unknown When did it last happen?Unknown on MAR  If all above answers are "NO", may proceed with cephalosporin use.    Review of Systems Unable to obtain  Physical Exam  General: Does not arouse during encounter Heart: Regular rate and rhythm. No murmur appreciated. Lungs: Fair air movement, clear Abdomen: Soft, nontender, nondistended, positive bowel sounds.  Ext: No significant edema Skin: Warm and dry  Vital Signs: BP (!) 128/53 (BP Location: Right Leg)   Pulse 60   Temp 98.9 F (37.2 C) (Axillary)   Resp 20   Ht '5\' 5"'$  (1.651 m)   Wt 84 kg   SpO2 93%   BMI 30.82 kg/m  Pain Scale: 0-10   Pain Score: 0-No pain   SpO2: SpO2: 93 % O2 Device:SpO2: 93 % O2 Flow Rate: .O2 Flow Rate (L/min): 2 L/min  IO: Intake/output summary:   Intake/Output Summary (Last 24 hours) at 06/13/2018 0943 Last data filed at 06/13/2018 0900 Gross per 24 hour  Intake 2076.86  ml  Output -  Net 2076.86 ml    LBM: Last BM Date: 06/12/18 Baseline Weight: Weight: 84 kg Most recent weight: Weight: 84 kg     Palliative Assessment/Data:   Flowsheet  Rows     Most Recent Value  Intake Tab  Referral Department  Hospitalist  Unit at Time of Referral  Med/Surg Unit  Palliative Care Primary Diagnosis  Neurology  Date Notified  06/10/18  Palliative Care Type  New Palliative care  Reason for referral  Clarify Goals of Care  Date of Admission  06/09/18  # of days IP prior to Palliative referral  1  Clinical Assessment  Psychosocial & Spiritual Assessment  Palliative Care Outcomes      Time In: 1700 Time Out: 1800 Time Total: 75 Greater than 50%  of this time was spent counseling and coordinating care related to the above assessment and plan.  Signed by: Micheline Rough, MD   Please contact Palliative Medicine Team phone at [phone] for questions and concerns.  For individual provider: See Shea Evans

## 2018-06-13 NOTE — Progress Notes (Signed)
SLP Cancellation Note  Patient Details Name: Ana Collins MRN: [id number] DOB: [birth year]   Cancelled treatment:       Reason Eval/Treat Not Completed: Patient at procedure or test/unavailable. Pt was approached for dysphagia tx but pt was being cleaned by nursing s/p episode of emesis following pt completing oral suctioning. SLP will follow up.    Scheryl Marten 06/13/2018, 3:24 PM

## 2018-06-14 ENCOUNTER — Inpatient Hospital Stay (HOSPITAL_COMMUNITY): Payer: Medicare Other

## 2018-06-14 DIAGNOSIS — Z515 Encounter for palliative care: Secondary | ICD-10-CM

## 2018-06-14 LAB — BASIC METABOLIC PANEL
Anion gap: 4 — ABNORMAL LOW (ref 5–15)
BUN: <5 mg/dL — ABNORMAL LOW (ref 8–23)
CHLORIDE: 126 mmol/L — AB (ref 98–111)
CO2: 24 mmol/L (ref 22–32)
Calcium: 8.7 mg/dL — ABNORMAL LOW (ref 8.9–10.3)
Creatinine, Ser: 0.75 mg/dL (ref 0.44–1.00)
GFR calc Af Amer: >60 mL/min (ref >60)
GFR calc non Af Amer: >60 mL/min (ref >60)
Glucose, Bld: 97 mg/dL (ref 70–99)
Potassium: 3.9 mmol/L (ref 3.5–5.1)
Sodium: 154 mmol/L — ABNORMAL HIGH (ref 135–145)

## 2018-06-14 LAB — CULTURE, BLOOD (ROUTINE X 2)
Culture: NO GROWTH
Culture: NO GROWTH
Special Requests: ADEQUATE

## 2018-06-14 MED ORDER — PHENOBARBITAL SODIUM 65 MG/ML IJ SOLN
65.0000 mg | Freq: Every day | INTRAMUSCULAR | Status: DC
  Administered 2018-06-14 – 2018-06-16 (×3): 65 mg via INTRAVENOUS
  Filled 2018-06-14 (×3): qty 1

## 2018-06-14 MED ORDER — LEVETIRACETAM IN NACL 1000 MG/100ML IV SOLN
1000.0000 mg | Freq: Two times a day (BID) | INTRAVENOUS | Status: DC
  Administered 2018-06-14: 1000 mg via INTRAVENOUS
  Filled 2018-06-14 (×2): qty 100

## 2018-06-14 MED ORDER — LEVETIRACETAM IN NACL 500 MG/100ML IV SOLN
500.0000 mg | Freq: Two times a day (BID) | INTRAVENOUS | Status: DC
  Administered 2018-06-15: 500 mg via INTRAVENOUS
  Filled 2018-06-14: qty 100

## 2018-06-14 MED ORDER — RESOURCE THICKENUP CLEAR PO POWD
ORAL | Status: DC | PRN
  Filled 2018-06-14: qty 125

## 2018-06-14 MED ORDER — SODIUM CHLORIDE 0.9 % IV SOLN
200.0000 mg | Freq: Two times a day (BID) | INTRAVENOUS | Status: DC
  Administered 2018-06-14 – 2018-06-17 (×6): 200 mg via INTRAVENOUS
  Filled 2018-06-14 (×8): qty 20

## 2018-06-14 MED ORDER — SODIUM CHLORIDE 0.9 % IV SOLN
1.5000 g | Freq: Three times a day (TID) | INTRAVENOUS | Status: DC
  Administered 2018-06-14 – 2018-06-16 (×6): 1.5 g via INTRAVENOUS
  Filled 2018-06-14 (×7): qty 1.5

## 2018-06-14 NOTE — Progress Notes (Addendum)
NEUROLOGY PROGRESS NOTE  Subjective: Patient in bed, NAD. 1L 02 [place]. Right arm and face are displaying twitching movements at rest.   Exam: Vitals:   06/13/18 2325 06/14/18 0612  BP: (!) 149/77 (!) 142/70  Pulse: 65 64  Resp: 20 20  Temp: 99.4 F (37.4 C) 99.4 F (37.4 C)  SpO2: 100% 100%    Physical Exam   HEENT-  Normocephalic, no lesions, without obvious abnormality.  Normal external eye and conjunctiva.  Extremities- Warm, dry and intact Musculoskeletal-no joint tenderness, deformity or swelling Skin-warm and dry, no hyperpigmentation, vitiligo, or suspicious lesions    Neuro:  Mental Status: Patient remains encephalopathic.  Does not answer any questions.  Does not follow commands.To noxious stimuli she will withdraw BLE. BUE grimaces to pain.    Moans to noxious stimuli and grimaces. Actively resist  Eye opening.  Cranial Nerves: II: Blinks to threat bilaterally III,IV, VI: Doll's intact bilaterally.  At rest she has disconjugate gaze and pupils equal, round, reactive to light V,VII: Patient's lower lip hangs to the left however head is turned to the left  VIII: No response to verbal commands Motor/ Sensory: BLE withdraws left > than right side to noxious. BUE grimaced to pain, but did not attempt to withdraw.  Plantars: Upgoing bilaterally   Medications:  Prior to Admission:  Medications Prior to Admission  Medication Sig Dispense Refill Last Dose  . acetaminophen (TYLENOL) 325 MG tablet Take 650 mg by mouth 2 (two) times daily.    06/08/2018 at Unknown time  . B Complex-C (B-COMPLEX WITH VITAMIN C) tablet Take 1 tablet by mouth daily.   06/08/2018 at Unknown time  . carboxymethylcellul-glycerin (REFRESH OPTIVE) 0.5-0.9 % ophthalmic solution Place 1 drop into both eyes 4 (four) times daily.   06/08/2018 at Unknown time  . cloNIDine (CATAPRES) 0.1 MG tablet Take 0.1 mg by mouth 3 (three) times daily.   06/08/2018 at Unknown time  . donepezil (ARICEPT) 10 MG tablet Take  10 mg by mouth at bedtime.   Past Week at Unknown time  . Fe Fum-FA-B Cmp-C-Zn-Mg-Mn-Cu (HEMOCYTE-PLUS PO) Take 1 capsule by mouth 2 (two) times daily.   06/08/2018 at Unknown time  . furosemide (LASIX) 20 MG tablet Take 60 mg by mouth daily.    06/08/2018 at Unknown time  . lacosamide 100 MG TABS Take 1 tablet (100 mg total) by mouth 2 (two) times daily. 60 tablet 1 Past Week at Unknown time  . levETIRAcetam (KEPPRA) 100 MG/ML solution Take 15 mLs by mouth 2 (two) times daily.    06/08/2018 at Unknown time  . LORazepam (ATIVAN) 0.5 MG tablet Take 1 tablet (0.5 mg total) by mouth 2 (two) times daily. 10 tablet 0 06/08/2018 at Unknown time  . losartan (COZAAR) 25 MG tablet Take 25 mg by mouth daily.    06/08/2018 at Unknown time  . Multiple Vitamin (MULTIVITAMIN WITH MINERALS) TABS tablet Take 1 tablet by mouth daily.   06/08/2018 at Unknown time  . Nutritional Supplements (RESOURCE 2.0 PO) Take 1 Can by mouth 3 (three) times daily between meals.   06/08/2018 at Unknown time  . omeprazole (PRILOSEC) 20 MG capsule Take 20 mg by mouth daily.   06/08/2018 at Unknown time  . ondansetron (ZOFRAN) 8 MG tablet Take by mouth every 8 (eight) hours as needed for nausea or vomiting.   06/08/2018 at Unknown time  . PHENobarbital (LUMINAL) 64.8 MG tablet Take 64.8 mg by mouth at bedtime.   06/08/2018 at Unknown time  .  potassium chloride (K-DUR) 10 MEQ tablet Take 20 mEq by mouth daily.   06/08/2018 at Unknown time  . pravastatin (PRAVACHOL) 80 MG tablet Take 80 mg by mouth daily.   06/08/2018 at Unknown time  . sertraline (ZOLOFT) 50 MG tablet Take 50 mg by mouth daily.   06/08/2018 at Unknown time  . sucralfate (CARAFATE) 1 g tablet Take 1 g by mouth 3 (three) times daily as needed Genella Rife).    unk  . Umeclidinium-Vilanterol (ANORO ELLIPTA IN) Inhale 1 puff into the lungs daily.   06/08/2018 at Unknown time   Scheduled: . amoxicillin-clavulanate  400 mg Oral Q12H  . donepezil  10 mg Oral QHS  . lacosamide  200 mg Oral BID  .  levETIRAcetam  1,000 mg Oral BID  . patiromer  16.8 g Oral Daily  . PHENobarbital  64.8 mg Oral QHS  . senna  1 tablet Oral BID   Continuous: . dextrose 100 mL/hr at 06/14/18 0051    Pertinent Labs/Diagnostics: -Sodium 154 -Chloride 124 -21  Routine EEG performed 2/11 : ThisEEG is consistent with anarea of focal nonspecific cerebral disturbance in the right temporal region in the setting of a more generalized nonspecific cerebral dysfunction (encephalopathy). There was no seizure or seizure predisposition recorded on this study. Please note that lack of epileptiform activity on EEG does not preclude the possibility of epilepsy.    Valentina Lucks, MSN, NP-C [facility] [phone]   Attending Neurologist note to follow:   Assessment: 63 year old female with past medical history of CVA with residual left-sided weakness, traumatic subdural hemorrhage, vascular dementia, A. fib with pacemaker, chronic diastolic heart failure admitted following a seizure in the setting of urinary tract infection.  No further seizures while she has been in-house.  She remains encephalopathic, worse today compared to yesterday.  Also noted to have upper extremity DVT.  We will continue reduce her seizure medication as she has been seizure-free for atleast 2 days and remains lethargic.    Impression:  -Seizure -Encephalopathy -UTI - Left upper limb DVT  Recommendations: -Will reduce Keppra to 500mg  BID from 1g BID, Vimpat 200 mg twice daily, phenobarbital - Repeat EEG negative for seizures  -Continue Rocephin per medicine team - Unfortunately, due to SDH unable to anticoagulate for DVT     06/14/2018, 9:27 AM  NEUROHOSPITALIST ADDENDUM Performed a face to face diagnostic evaluation.   I have reviewed the contents of history and physical exam as documented by PA/ARNP/Resident and agree with above documentation.  I have discussed and formulated the above plan as documented.  Edits to the note have been made as needed.  Impression:Patient remains lethargic, opens eyes to voice, answers her name before drifting back to sleep.  Repeat EEG was performed today, again no subclinical seizures. I will continue tapering Keppra and we can hopefully discontinue it to see if this may help her lethargy.  I am afraid the patient has had a gradual decline from prolonged hospitalization, inadequate nutrition, multiple seizures in the setting of a brain that has prior infarct and SDH -with urinary tract infection, upper limb DVT and multiple seizure medications to make matters worse.  Appreciate palliative care involvement.      Georgiana Spinner Rox Mcgriff MD [facility] [phone]   If 7pm to 7am, please call on call as listed on AMION.

## 2018-06-14 NOTE — Progress Notes (Signed)
  Speech Language Pathology Treatment: Dysphagia  Patient Details Name: Ana Collins MRN: [id number] DOB: 09/13/1955 Today's Date: 06/14/2018 Time: 3903-0092 SLP Time Calculation (min) (ACUTE ONLY): 20 min  Assessment / Plan / Recommendation Clinical Impression  Pt was seen for dysphagia treatment to assess tolerance of the recommended diet. She demonstrated difficulty maintaining alertness for prolonged periods and p.o. intake was ultimately discontinued for this reason. Pt's daughter-in-law was present throughout the session and she reported that the pt has not been eating much. Pt tolerated nectar thick liquids via cup and tsp without overt s/sx of aspiration. She tolerated 4/5 boluses of jello without overt s/sx of aspiration but as her alertness reduced some, she exhibited increased pocketing to the left and coughing. Aspiration of jello before deglutition is therefore suspected and residue was cleared from the left lateral sulcus with suctioning. Pt's family was educated regarding swallowing precautions to reduce aspiration risk and she required cues to reduce intake rate and bolus size. SLP will continue to follow pt.    HPI HPI: Pt is a 63 year old female with past history of CVA, right subdural hematoma and epilepsy.  Patient already on 3 medications Keppra, phenobarbital and Vimpat. Admitted because she had a breakthrough seizure likely in the setting of urinary tract infection. On 2/11/2 patient seem more somnolent and was having quasi-rhythmic jerking/twitching right upper extremity. An EEG was obtained and was negative for epileptiform activity and there was no clinical correlate for right upper extremity movement. A dysphagia 1 diet with thin liquids was recommended on 06/10/18 but was downgraded to nectar thick liquids by nursing due to observance of signs of aspiration with thin liquids.        SLP Plan  Continue with current plan of care       Recommendations  Diet  recommendations: Dysphagia 1 (puree);Nectar-thick liquid Liquids provided via: Cup;Straw Medication Administration: Crushed with puree Supervision: Full supervision/cueing for compensatory strategies Compensations: Minimize environmental distractions;Slow rate;Small sips/bites;Follow solids with liquid Postural Changes and/or Swallow Maneuvers: Seated upright 90 degrees;Upright 30-60 min after meal                Oral Care Recommendations: Oral care BID Follow up Recommendations: Skilled Nursing facility SLP Visit Diagnosis: Dysphagia, oropharyngeal phase (R13.12) Plan: Continue with current plan of care       Ana Collins I. Vear Clock, MS, CCC-SLP Acute Rehabilitation Services Office number [phone] Pager [phone]                Ana Collins 06/14/2018, 4:53 PM

## 2018-06-14 NOTE — Progress Notes (Deleted)
RT called to pt room for a rapid response. Pt rhonchi/coarse crackles, sat 86% on 6lpm [place]. Pt very tachypneic at 41 bpm upon arrival. RT NTS'd pt acquired moderate amount of secretions green/yellow in color, purulent, and thick. Pt still desaturating. At this time RT placed pt on HFNC salter 10 lpm pt currently satting 95%. Pt work of breathing somewhat better but still tachypneic. RT will continue to monitor.

## 2018-06-14 NOTE — Progress Notes (Signed)
EEG completed; results pending.    

## 2018-06-14 NOTE — Progress Notes (Signed)
   Patient seen, chart reviewed.  Patient's son and daughter-in-law are at the bedside.  Patient's daughter-in-law, Byrd Hesselbach, shares that her mother actually looked a little more alert yesterday and ate a few bites.  Patient is quite somnolent when I was in the room.  She opened her eyes to voice but was nonverbal.  EEG has been completed, results pending.  Reviewed neurology notes and see that reduction in anticonvulsants is being attempted to see if her encephalopathy will improve.  Psychosocial support offered as well as discussions regarding eating and drinking in the setting of serious illness.  Specifically, how the drive to eat such as "hunger" and "thirst" is lifted in the setting of serious illness; bites and sips become the new normal.  Byrd Hesselbach was able to recall that same scenario when her parents were ill .  Palliative medicine to continue to provide support.  Thank you, Eduard Roux, NP Time spent: 20 minutes Greater than 50% of time was spent in counseling and coordination of care

## 2018-06-14 NOTE — Progress Notes (Signed)
[facility] PROGRESS NOTE    Progress Note  Ana Collins  XLK:[id number] DOB: [birth year] DOA: 06/09/2018 PCP: Karna DupesBlake, Khashana A, MD     Brief Narrative:   Ana BarrenLinda Collins is an [age] y.o. female past medical history significant for atrial fibrillation, previous stroke, who was on Eliquis until fall which prompted to discontinued the Eliquis as she had a subdural hematoma, recently treated for UTI on 06/07/2018 with Cipro congestive heart failure, seizures due to her strokes on Keppra, phenobarbital and Vimpat comes into the ED for active tonic-clonic seizures, in the ED she was started on IV Keppra and Lorazepam.  Assessment/Plan:   Seizure, late effect of stroke [facility](HCC): She is currently on Keppra phenobarbital and Vimpat. Continue seizure precautions and antibiotic as recommended by neurology. Following evaluation show moderate risk for aspiration. Awaiting neuro recommendations.  Possible UTI: Urine culture was ordered and showed Proteus  with more than 100,000 colonies changes to liquid Augmentin.  Hypernatremia: Continue D5W sodium is improving very slowly.  Hyperkalemia:  EKG showed no peaked T waves potassium improved today.  Chronic atrial fibrillation: Not a candidate for anticoagulation due to subdural hematoma, which she still present from November 2019 Rate controlled.  Left upper extremity swelling: Duplex ultrasound+ upper extremity DVT, discussed the case with neurosurgery he recommended against anticoagulation due to the high risk of rebleeding into her subdural hematoma.  DVT prophylaxis: SCD Family Communication:Son Disposition Plan/Barrier to D/C: snf when medically stable. Code Status:     Code Status Orders  (From admission, onward)         Start     Ordered   06/09/18 1635  Full code  Continuous     06/09/18 1637        Code Status History    Date Active Date Inactive Code Status Order ID Comments User Context   06/01/2018 0439 06/08/2018 0138 Full  Code [id number]  [name], Vasundhra, MD ED        IV Access:    Peripheral IV   Procedures and diagnostic studies:   Dg Swallowing Func-speech Pathology  Result Date: 06/12/2018 Objective Swallowing Evaluation: Type of Study: MBS-Modified Barium Swallow Study  Patient Details Name: Ana BarrenLinda Collins MRN: [id number] Date of Birth: [birth year] Today's Date: 06/12/2018 Time: SLP Start Time (ACUTE ONLY): 1425 -SLP Stop Time (ACUTE ONLY): 1446 SLP Time Calculation (min) (ACUTE ONLY): 21 min Past Medical History: Past Medical History: Diagnosis Date . Anemia  . Atrial fibrillation (HCC)  . CHF (congestive heart failure) (HCC)   diastolic . CVD (cardiovascular disease)  . Dementia (HCC)   vascular . Epilepsy (HCC)  . GERD (gastroesophageal reflux disease)  . Heart disease   chronic ischemic . Hyperlipidemia  . Hypertension  . Pacemaker  . Traumatic subdural hemorrhage (HCC)   w/o LOC Past Surgical History: No past surgical history on file. HPI: Pt is a 63 year old female with past history of CVA, right subdural hematoma and epilepsy.  Patient already on 3 medications Keppra, phenobarbital and Vimpat. Admitted because she had a breakthrough seizure likely in the setting of urinary tract infection. On 2/11/2 patient seem more somnolent and was having quasi-rhythmic jerking/twitching right upper extremity. An EEG was obtained and was negative for epileptiform activity and there was no clinical correlate for right upper extremity movement. A dysphagia 1 diet with thin liquids was recommended on 06/10/18 but was downgraded to nectar thick liquids by nursing due to observance of signs of aspiration with thin liquids.   Subjective: pt awake in bed. daughter  in law present Assessment / Plan / Recommendation CHL IP CLINICAL IMPRESSIONS 06/12/2018 Clinical Impression Pt presents with moderate oropharyngeal dysphagia characterized by a moderate-severe oral transit delay, impaired bolus control which resulted in premature  spillage, and a pharyngeal delay. No penetration/aspiration were noted during the study; however, considering her variable alertness combined with her oropharyngeal delay, her risk of aspiration before deglutition is high with thin liquids. It is therefore recommended that a dysphagia 1 diet with nectar thick liquids be continued and SLP will continue to follow pt.  SLP Visit Diagnosis Dysphagia, oropharyngeal phase (R13.12) Attention and concentration deficit following -- Frontal lobe and executive function deficit following -- Impact on safety and function Mild aspiration risk;Moderate aspiration risk   CHL IP TREATMENT RECOMMENDATION 06/12/2018 Treatment Recommendations Therapy as outlined in treatment plan below   Prognosis 06/12/2018 Prognosis for Safe Diet Advancement Fair Barriers to Reach Goals Cognitive deficits;Severity of deficits Barriers/Prognosis Comment -- CHL IP DIET RECOMMENDATION 06/12/2018 SLP Diet Recommendations Dysphagia 1 (Puree) solids;Nectar thick liquid Liquid Administration via Cup;Straw Medication Administration Whole meds with puree Compensations Minimize environmental distractions;Slow rate;Small sips/bites Postural Changes Remain semi-upright after after feeds/meals (Comment)   CHL IP OTHER RECOMMENDATIONS 06/12/2018 Recommended Consults -- Oral Care Recommendations Oral care BID Other Recommendations Have oral suction available   CH[facility] IP FOLLOW UP RECOMMENDATIONS 06/12/2018 Follow up Recommendations Skilled Nursing facility   [facility] IP FREQUENCY AND DURATION 06/12/2018 Speech Therapy Frequency (ACUTE ONLY) min 2x/week Treatment Duration 2 weeks      CHL IP ORAL PHASE 06/12/2018 Oral Phase Impaired Oral - Pudding Teaspoon -- Oral - Pudding Cup -- Oral - Honey Teaspoon -- Oral - Honey Cup -- Oral - Nectar Teaspoon -- Oral - Nectar Cup -- Oral - Nectar Straw Delayed oral transit;Premature spillage Oral - Thin Teaspoon -- Oral - Thin Cup Delayed oral transit;Premature spillage Oral - Thin Straw --  Oral - Puree Delayed oral transit;Premature spillage Oral - Mech Soft Impaired mastication;Delayed oral transit;Premature spillage Oral - Regular -- Oral - Multi-Consistency -- Oral - Pill -- Oral Phase - Comment --  CHL IP PHARYNGEAL PHASE 06/12/2018 Pharyngeal Phase Impaired Pharyngeal- Pudding Teaspoon -- Pharyngeal -- Pharyngeal- Pudding Cup -- Pharyngeal -- Pharyngeal- Honey Teaspoon -- Pharyngeal -- Pharyngeal- Honey Cup -- Pharyngeal -- Pharyngeal- Nectar Teaspoon -- Pharyngeal -- Pharyngeal- Nectar Cup -- Pharyngeal -- Pharyngeal- Nectar Straw Delayed swallow initiation-vallecula Pharyngeal -- Pharyngeal- Thin Teaspoon -- Pharyngeal -- Pharyngeal- Thin Cup -- Pharyngeal -- Pharyngeal- Thin Straw Delayed swallow initiation-pyriform sinuses Pharyngeal -- Pharyngeal- Puree Delayed swallow initiation-vallecula Pharyngeal -- Pharyngeal- Mechanical Soft Delayed swallow initiation-vallecula Pharyngeal -- Pharyngeal- Regular -- Pharyngeal -- Pharyngeal- Multi-consistency -- Pharyngeal -- Pharyngeal- Pill -- Pharyngeal -- Pharyngeal Comment --  CHL IP CERVICAL ESOPHAGEAL PHASE 06/12/2018 Cervical Esophageal Phase WFL Pudding Teaspoon -- Pudding Cup -- Honey Teaspoon -- Honey Cup -- Nectar Teaspoon -- Nectar Cup -- Nectar Straw -- Thin Teaspoon -- Thin Cup -- Thin Straw -- Puree -- Mechanical Soft -- Regular -- Multi-consistency -- Pill -- Cervical Esophageal Comment -- Shanika I. Vear Clock, MS, CCC-SLP Acute Rehabilitation Services Office number [phone] Pager [phone] Scheryl Marten 06/12/2018, 4:22 PM              Vas [place] Upper Extremity Venous Duplex  Result Date: 06/13/2018 UPPER VENOUS STUDY  Indications: Swelling Limitations: Body habitus, poor ultrasound/tissue interface and patient contracted on left side. Performing Technologist: Gertie Fey MHA, RDMS, RVT, RDCS  Examination Guidelines: A complete evaluation includes B-mode imaging, spectral Doppler, color Doppler,  and power Doppler as  needed of all accessible portions of each vessel. Bilateral testing is considered an integral part of a complete examination. Limited examinations for reoccurring indications may be performed as noted.  Right Findings: +----------+------------+----------+---------+-----------+--------------+ RIGHT     CompressiblePropertiesPhasicitySpontaneous   Summary     +----------+------------+----------+---------+-----------+--------------+ IJV           Full                 Yes       Yes                   +----------+------------+----------+---------+-----------+--------------+ Subclavian    Full                 Yes       Yes                   +----------+------------+----------+---------+-----------+--------------+ Axillary      None                           No         Acute      +----------+------------+----------+---------+-----------+--------------+ Brachial      None                           No         Acute      +----------+------------+----------+---------+-----------+--------------+ Radial        Full                                                 +----------+------------+----------+---------+-----------+--------------+ Ulnar                                               Not visualized +----------+------------+----------+---------+-----------+--------------+ Cephalic      Full                                                 +----------+------------+----------+---------+-----------+--------------+ Basilic                                             Not visualized +----------+------------+----------+---------+-----------+--------------+  Left Findings: +----------+------------+----------+---------+-----------+--------------+ LEFT      CompressiblePropertiesPhasicitySpontaneous   Summary     +----------+------------+----------+---------+-----------+--------------+ IJV                                                 Not visualized  +----------+------------+----------+---------+-----------+--------------+ Subclavian                                          Not visualized +----------+------------+----------+---------+-----------+--------------+ Axillary  Not visualized +----------+------------+----------+---------+-----------+--------------+ Brachial      Full                 Yes       Yes                   +----------+------------+----------+---------+-----------+--------------+ Radial                                              Not visualized +----------+------------+----------+---------+-----------+--------------+ Ulnar                                               Not visualized +----------+------------+----------+---------+-----------+--------------+ Cephalic      None                                      Acute      +----------+------------+----------+---------+-----------+--------------+ Basilic                                             Not visualized +----------+------------+----------+---------+-----------+--------------+  Summary:  Right: Findings consistent with acute deep vein thrombosis involving the right axillary vein and right brachial veins.  Left: No evidence of deep vein thrombosis in the upper extremity involving the visualized veins of the left upper extremity. Findings consistent with acute superficial vein thrombosis involving the left cephalic vein. This was a limited study.  *See table(s) above for measurements and observations.  Diagnosing physician: Sherald Hesshristopher Clark MD Electronically signed by Sherald Hesshristopher Clark MD on 06/13/2018 at 2:55:47 PM.    Final      Medical Consultants:    None.  Anti-Infectives:   [place]  Subjective:    Ana BarrenLinda Collins nonverbal, lethargic.  Objective:    Vitals:   06/13/18 1625 06/13/18 2120 06/13/18 2325 06/14/18 0612  BP: 134/77 (!) 155/83 (!) 149/77 (!) 142/70  Pulse: 63 62 65  64  Resp: (!) 26 18 20 20   Temp: 98.7 F (37.1 C) 99.5 F (37.5 C) 99.4 F (37.4 C) 99.4 F (37.4 C)  TempSrc: Oral Oral Oral Oral  SpO2: 96% 100% 100% 100%  Weight:      Height:        Intake/Output Summary (Last 24 hours) at 06/14/2018 1055 Last data filed at 06/14/2018 0823 Gross per 24 hour  Intake 600 ml  Output -  Net 600 ml   Filed Weights   06/09/18 2311  Weight: 84 kg    Exam: General exam: In no acute distress. Respiratory system: Good air movement and clear to auscultation. Cardiovascular system: S1 & S2 heard, RRR. No JVD.  Gastrointestinal system: Abdomen is nondistended, soft and nontender.  Central nervous system: Somnolent not able to follow commands cannot answer questions pupils are equally round and reactive face is symmetric strength is 4 out of 5 in all 4 extremities.  Withdraws to pain Extremities: No pedal edema. Skin: No rashes, lesions or ulcers    Data Reviewed:    Labs: Basic Metabolic Panel: Recent Labs  Lab 06/09/18 1320 06/10/18 1145 06/11/18 1550 06/12/18 1210 06/13/18 1244 06/14/18 [id number]  NA 150* 156* 155* 154* 154* 154*  K 4.0 3.5 5.6* 3.9 3.5 3.9  CL 118* 125* 129* 124* 125* 126*  CO2 23 24 18* 21* 26 24  GLUCOSE 104* 111* 83 115* 115* 97  BUN <5* 5* <5* <5* <5* <5*  CREATININE 0.79 0.82 0.70 0.80 0.86 0.75  CALCIUM 9.1 8.3* 8.4* 8.7* 8.8* 8.7*  MG 1.9  --   --   --   --   --    GFR Estimated Creatinine Clearance: 76 mL/min (by C-G formula based on SCr of 0.75 mg/dL). Liver Function Tests: Recent Labs  Lab 06/09/18 1320  AST 65*  ALT 25  ALKPHOS 136*  BILITOT 0.9  PROT 8.3*  ALBUMIN 2.6*   No results for input(s): LIPASE, AMYLASE in the last 168 hours. Recent Labs  Lab 06/13/18 1244  AMMONIA 51*   Coagulation profile No results for input(s): INR, PROTIME in the last 168 hours.  CBC: Recent Labs  Lab 06/09/18 1320 06/10/18 1145  WBC 9.3 8.8  NEUTROABS 6.8 6.7  HGB 10.4* 10.1*  HCT 35.6* 34.3*  MCV  90.8 90.0  PLT PLATELET CLUMPS NOTED ON SMEAR, UNABLE TO ESTIMATE 103*   Cardiac Enzymes: Recent Labs  Lab 06/09/18 1320  TROPONINI <0.03   BNP (last 3 results) No results for input(s): PROBNP in the last 8760 hours. CBG: Recent Labs  Lab 06/12/18 0749  GLUCAP 86   D-Dimer: No results for input(s): DDIMER in the last 72 hours. Hgb A1c: No results for input(s): HGBA1C in the last 72 hours. Lipid Profile: No results for input(s): CHOL, HDL, LDLCALC, TRIG, CHOLHDL, LDLDIRECT in the last 72 hours. Thyroid function studies: No results for input(s): TSH, T4TOTAL, T3FREE, THYROIDAB in the last 72 hours.  Invalid input(s): FREET3 Anemia work up: No results for input(s): VITAMINB12, FOLATE, FERRITIN, TIBC, IRON, RETICCTPCT in the last 72 hours. Sepsis Labs: Recent Labs  Lab 06/09/18 1320 06/09/18 1821 06/09/18 2230 06/10/18 1145  WBC 9.3  --   --  8.8  LATICACIDVEN  --  1.9 1.9  --    Microbiology Recent Results (from the past 240 hour(s))  Blood culture (routine x 2)     Status: None   Collection Time: 06/09/18  1:20 PM  Result Value Ref Range Status   Specimen Description BLOOD LEFT ANTECUBITAL  Final   Special Requests   Final    BOTTLES DRAWN AEROBIC ONLY Blood Culture adequate volume   Culture   Final    NO GROWTH 5 DAYS Performed at [facility], [address] [place], [place] [address]    Report Status 06/14/2018 FINAL  Final  Urine culture     Status: Abnormal   Collection Time: 06/09/18  2:29 PM  Result Value Ref Range Status   Specimen Description URINE, CLEAN CATCH  Final   Special Requests   Final    NONE Performed at [facility], [address], [place], [place] [address]    Culture >=100,000 COLONIES/mL PROTEUS MIRABILIS (A)  Final   Report Status 06/11/2018 FINAL  Final   Organism ID, Bacteria PROTEUS MIRABILIS (A)  Final      Susceptibility   Proteus mirabilis - MIC*    AMPICILLIN 8 SENSITIVE Sensitive     CEFAZOLIN 8 SENSITIVE  Sensitive     CEFTRIAXONE <=1 SENSITIVE Sensitive     CIPROFLOXACIN >=4 RESISTANT Resistant     GENTAMICIN 8 INTERMEDIATE Intermediate     IMIPENEM 2 SENSITIVE Sensitive  NITROFURANTOIN 256 RESISTANT Resistant     TRIMETH/SULFA >=320 RESISTANT Resistant     AMPICILLIN/SULBACTAM 4 SENSITIVE Sensitive     PIP/TAZO <=4 SENSITIVE Sensitive     * >=100,000 COLONIES/mL PROTEUS MIRABILIS  Blood culture (routine x 2)     Status: None   Collection Time: 06/09/18  6:10 PM  Result Value Ref Range Status   Specimen Description BLOOD LEFT HAND  Final   Special Requests   Final    AEROBIC BOTTLE ONLY Blood Culture results may not be optimal due to an inadequate volume of blood received in culture bottles   Culture   Final    NO GROWTH 5 DAYS Performed at [facility], [address] [place], [place] [address]    Report Status 06/14/2018 FINAL  Final     Medications:   . amoxicillin-clavulanate  400 mg Oral Q12H  . donepezil  10 mg Oral QHS  . lacosamide  200 mg Oral BID  . levETIRAcetam  1,000 mg Oral BID  . patiromer  16.8 g Oral Daily  . PHENobarbital  64.8 mg Oral QHS  . senna  1 tablet Oral BID   Continuous Infusions: . dextrose 100 mL/hr at 06/14/18 0051     LOS: 4 days   Marinda Elk  [facility]  06/14/2018, 10:55 AM

## 2018-06-14 NOTE — Procedures (Signed)
History: 63 year old female being evaluated for seizures in the setting of previous stroke  Sedation: None  Technique: This is a 21 channel routine scalp EEG performed at the bedside with bipolar and monopolar montages arranged in accordance to the international 10/20 system of electrode placement. One channel was dedicated to EKG recording.    Background: With arousal, there is a posterior dominant rhythm that achieves a frequency of 8 Hz, but this is poorly sustained and poorly formed.  There is admixed generalized irregular delta and theta activities throughout the recording.  In addition, there are occasional interictal low voltage spike discharges arising from the right posterior quadrant.  Photic stimulation: Physiologic driving is not performed  EEG Abnormalities: 1) interictal right posterior quadrant spike discharges 2) generalized irregular slow activity  Clinical Interpretation: This EEG is consistent with an area of potential epileptogenicity in the right posterior quadrant in the setting of a mild generalized nonspecific cerebral dysfunction (encephalopathy).   There was no seizure recorded on this study. Please note that lack of epileptiform activity on EEG does not preclude the possibility of epilepsy.   Ritta Slot, MD [facility] [phone]  If 7pm- 7am, please page neurology on call as listed in [facility].

## 2018-06-15 DIAGNOSIS — Z7189 Other specified counseling: Secondary | ICD-10-CM

## 2018-06-15 NOTE — Progress Notes (Signed)
  Patient seen, chart reviewed.  Patient's son is at the bedside.  Patient is more alert today and is speaking yes/no answers.  He is observed to be feeding her according to speech recommendations small bites and sips.  She does cough.  She is asking for more to drink  Updated patient's son as to recent neurological recommendations specifically discontinuation of Keppra.  Son was asking whether records have been received from [place] and I did attempt to see if we had received anything but was unable to locate them on either patient's chart or per Epic review  Patient's son states he sees improvement today.  We talked about aspiration risks.  He verbalized good understanding that despite doing safest feeding methods possible, she could still aspirate and the consequences of that.  Patient's son is not ready to talk about next steps such as potential feeding tubes, if his mother would want that or not.  He does share that he is hoping that as she becomes more alert this will not be an issue.  At this point his plan is to return to skilled nursing facility.  Palliative medicine to stay involved and offer supportive  care to patient and family during this hospitalization.  Hopefully patient can stabilize and return to SNF, but would benefit from ongoing goals of care discussion and affiliation with a community palliative care provider.  Those resources can be accessed through either [facility] at [phone] or [facility] palliative medicine division at [phone]  No changes to current plan of care or patient goals.  She remains a full code.  Thank you, Eduard Roux, NP Time spent: 20 minutes Greater than 50% of time spent in counseling and coordination of care

## 2018-06-15 NOTE — Progress Notes (Addendum)
NEUROLOGY PROGRESS NOTE  Subjective: Patient in bed, NAD. 1.5L 02 [place]. Left arm displaying some twitching movements. Keppra was reduced yesterday, and today patient is able to follow some commands. Plan to completely stop Keppra today.  Exam: Vitals:   06/14/18 2120 06/15/18 0116  BP: (!) 135/95 132/80  Pulse: 60 62  Resp: 20   Temp:  98.2 F (36.8 C)  SpO2: 100%     Physical Exam   HEENT-  Normocephalic, no lesions, without obvious abnormality.  Normal external eye and conjunctiva.  Extremities- Warm, dry and intact Musculoskeletal-no joint tenderness, deformity or swelling Skin-warm and dry, no hyperpigmentation, vitiligo, or suspicious lesions    Neuro:  Mental Status: Patient remains encephalopathic.  Does not answer any questions.  Does follow some commands ( open and close eyes, squeeze my hands). To noxious stimuli she will withdraw BLE. Withdraws RLE more than LLE. BUE grimaces to pain. Moans to noxious stimuli and grimaces. Actively resist  Eye opening.  Was able to squeeze hand on with right hand, but not left. Cranial Nerves: II: Blinks to threat bilaterally III,IV, VI: Doll's intact bilaterally.  At rest she has disconjugate gaze and pupils equal, round, reactive to light V,VII: Patient's lower lip hangs to the left however head is turned to the left  VIII: able to follow some simple commands. Motor/ Sensory: BLE withdraws right > than left side to noxious. BUE grimaced to pain, but did not attempt to withdraw. Squeeze with right hand,. But not left.   Plantars: Upgoing bilaterally   Medications:  Prior to Admission:  Medications Prior to Admission  Medication Sig Dispense Refill Last Dose  . acetaminophen (TYLENOL) 325 MG tablet Take 650 mg by mouth 2 (two) times daily.    06/08/2018 at Unknown time  . B Complex-C (B-COMPLEX WITH VITAMIN C) tablet Take 1 tablet by mouth daily.   06/08/2018 at Unknown time  . carboxymethylcellul-glycerin (REFRESH OPTIVE) 0.5-0.9 %  ophthalmic solution Place 1 drop into both eyes 4 (four) times daily.   06/08/2018 at Unknown time  . cloNIDine (CATAPRES) 0.1 MG tablet Take 0.1 mg by mouth 3 (three) times daily.   06/08/2018 at Unknown time  . donepezil (ARICEPT) 10 MG tablet Take 10 mg by mouth at bedtime.   Past Week at Unknown time  . Fe Fum-FA-B Cmp-C-Zn-Mg-Mn-Cu (HEMOCYTE-PLUS PO) Take 1 capsule by mouth 2 (two) times daily.   06/08/2018 at Unknown time  . furosemide (LASIX) 20 MG tablet Take 60 mg by mouth daily.    06/08/2018 at Unknown time  . lacosamide 100 MG TABS Take 1 tablet (100 mg total) by mouth 2 (two) times daily. 60 tablet 1 Past Week at Unknown time  . levETIRAcetam (KEPPRA) 100 MG/ML solution Take 15 mLs by mouth 2 (two) times daily.    06/08/2018 at Unknown time  . LORazepam (ATIVAN) 0.5 MG tablet Take 1 tablet (0.5 mg total) by mouth 2 (two) times daily. 10 tablet 0 06/08/2018 at Unknown time  . losartan (COZAAR) 25 MG tablet Take 25 mg by mouth daily.    06/08/2018 at Unknown time  . Multiple Vitamin (MULTIVITAMIN WITH MINERALS) TABS tablet Take 1 tablet by mouth daily.   06/08/2018 at Unknown time  . Nutritional Supplements (RESOURCE 2.0 PO) Take 1 Can by mouth 3 (three) times daily between meals.   06/08/2018 at Unknown time  . omeprazole (PRILOSEC) 20 MG capsule Take 20 mg by mouth daily.   06/08/2018 at Unknown time  . ondansetron (ZOFRAN)  8 MG tablet Take by mouth every 8 (eight) hours as needed for nausea or vomiting.   06/08/2018 at Unknown time  . PHENobarbital (LUMINAL) 64.8 MG tablet Take 64.8 mg by mouth at bedtime.   06/08/2018 at Unknown time  . potassium chloride (K-DUR) 10 MEQ tablet Take 20 mEq by mouth daily.   06/08/2018 at Unknown time  . pravastatin (PRAVACHOL) 80 MG tablet Take 80 mg by mouth daily.   06/08/2018 at Unknown time  . sertraline (ZOLOFT) 50 MG tablet Take 50 mg by mouth daily.   06/08/2018 at Unknown time  . sucralfate (CARAFATE) 1 g tablet Take 1 g by mouth 3 (three) times daily as needed Genella Rife(Gerd).     unk  . Umeclidinium-Vilanterol (ANORO ELLIPTA IN) Inhale 1 puff into the lungs daily.   06/08/2018 at Unknown time   Scheduled: . donepezil  10 mg Oral QHS  . PHENObarbital  65 mg Intravenous QHS  . senna  1 tablet Oral BID   Continuous: . ampicillin-sulbactam (UNASYN) IV 1.5 g (06/15/18 0650)  . dextrose 100 mL/hr at 06/15/18 0248  . lacosamide (VIMPAT) IV 200 mg (06/15/18 0503)  . levETIRAcetam 500 mg (06/15/18 0405)    Pertinent Labs/Diagnostics: -Sodium 154 -Chloride 126   Routine EEG performed 2/11 : ThisEEG is consistent with anarea of focal nonspecific cerebral disturbance in the right temporal region in the setting of a more generalized nonspecific cerebral dysfunction (encephalopathy). There was no seizure or seizure predisposition recorded on this study. Please note that lack of epileptiform activity on EEG does not preclude the possibility of epilepsy.   rEEG 06/14/2018: Clinical Interpretation: This EEG is consistent with an area of potential epileptogenicity in the right posterior quadrant in the setting of a mild generalized nonspecific cerebral dysfunction (encephalopathy).   There was no seizure recorded on this study. Please note that lack of epileptiform activity on EEG does not preclude the possibility of epilepsy.  Valentina LucksJessica Williams, MSN, NP-C [facility] [phone]   Attending Neurologist note to follow:   Assessment: 63 year old female with past medical history of CVA with residual left-sided weakness, traumatic subdural hemorrhage, vascular dementia, A. fib with pacemaker, chronic diastolic heart failure admitted following a seizure in the setting of urinary tract infection.  No further seizures while she has been in-house.  She remains encephalopathic, worse today compared to yesterday.  Also noted to have upper extremity DVT.  We will continue reduce her seizure medication as she has been seizure-free for atleast 2 days and remains lethargic.   Repeat EEG was negative for seizures on 06/14/2018. Today will completely stop Keppra.   Impression:  -Seizure -Encephalopathy -UTI - Left upper limb DVT  Recommendations: -Will stop Keppra today ,  Continue Vimpat 200 mg twice daily, continue phenobarbital -Continue Rocephin per medicine team - Unfortunately, due to SDH unable to anticoagulate for DVT - Neurology will be available as needed , please call with any questions.    06/15/2018, 8:30 AM  NEUROHOSPITALIST ADDENDUM Performed a face to face diagnostic evaluation.   I have reviewed the contents of history and physical exam as documented by PA/ARNP/Resident and agree with above documentation.  I have discussed and formulated the above plan as documented. Edits to the note have been made as needed.  Patient slightly more awake today.  We will discontinue Keppra and continue Vimpat and phenobarbital for seizures.  Hopefully she continues to make improvements.  Neurology will be available as needed.  Please call back with any questions.  Georgiana Spinner  MD [facility] [phone]   If 7pm to 7am, please call on call as listed on AMION.

## 2018-06-15 NOTE — Progress Notes (Signed)
[facility] PROGRESS NOTE    Progress Note  Ana BarrenLinda Collins  UVO:[id number] DOB: 14-Apr-1956 DOA: 06/09/2018 PCP: Karna DupesBlake, Khashana A, MD     Brief Narrative:   Ana Collins is an 63 y.o. female past medical history significant for atrial fibrillation, previous stroke, who was on Eliquis until fall which prompted to discontinued the Eliquis as she had a subdural hematoma, recently treated for UTI on 06/07/2018 with Cipro congestive heart failure, seizures due to her strokes on Keppra, phenobarbital and Vimpat comes into the ED for active tonic-clonic seizures, in the ED she was started on IV Keppra and Lorazepam.  Assessment/Plan:   Seizure, late effect of stroke (HCC): Continue phenobarbital and Vimpat. Neurology recommended to discontinue Keppra. Further management per neurology.  Possible UTI: Urine culture was ordered and showed Proteus  with more than 100,000 colonies, she has completed her antibiotic course.  Hypernatremia: Continue D5W, is currently 154.  Hyperkalemia:  EKG showed no peaked T waves potassium improved today.  Chronic atrial fibrillation: Not a candidate for anticoagulation due to subdural hematoma, which she still present from November 2019 Rate controlled.  Left upper extremity swelling: Duplex ultrasound+ upper extremity DVT, discussed the case with neurosurgery he recommended against anticoagulation due to the high risk of rebleeding into her subdural hematoma.  DVT prophylaxis: SCD Family Communication:Son Disposition Plan/Barrier to D/C: snf when medically stable. Code Status:     Code Status Orders  (From admission, onward)         Start     Ordered   06/09/18 1635  Full code  Continuous     06/09/18 1637        Code Status History    Date Active Date Inactive Code Status Order ID Comments User Context   06/01/2018 0439 06/08/2018 0138 Full Code [id number]  [name], Vasundhra, MD ED        IV Access:    Peripheral IV   Procedures  and diagnostic studies:   [place] Upper Extremity Venous Duplex  Result Date: 06/13/2018 UPPER VENOUS STUDY  Indications: Swelling Limitations: Body habitus, poor ultrasound/tissue interface and patient contracted on left side. Performing Technologist: Gertie FeyMichelle Simonetti MHA, RDMS, RVT, RDCS  Examination Guidelines: A complete evaluation includes B-mode imaging, spectral Doppler, color Doppler, and power Doppler as needed of all accessible portions of each vessel. Bilateral testing is considered an integral part of a complete examination. Limited examinations for reoccurring indications may be performed as noted.  Right Findings: +----------+------------+----------+---------+-----------+--------------+ RIGHT     CompressiblePropertiesPhasicitySpontaneous   Summary     +----------+------------+----------+---------+-----------+--------------+ IJV           Full                 Yes       Yes                   +----------+------------+----------+---------+-----------+--------------+ Subclavian    Full                 Yes       Yes                   +----------+------------+----------+---------+-----------+--------------+ Axillary      None                           No         Acute      +----------+------------+----------+---------+-----------+--------------+ Brachial      None  No         Acute      +----------+------------+----------+---------+-----------+--------------+ Radial        Full                                                 +----------+------------+----------+---------+-----------+--------------+ Ulnar                                               Not visualized +----------+------------+----------+---------+-----------+--------------+ Cephalic      Full                                                 +----------+------------+----------+---------+-----------+--------------+ Basilic                                              Not visualized +----------+------------+----------+---------+-----------+--------------+  Left Findings: +----------+------------+----------+---------+-----------+--------------+ LEFT      CompressiblePropertiesPhasicitySpontaneous   Summary     +----------+------------+----------+---------+-----------+--------------+ IJV                                                 Not visualized +----------+------------+----------+---------+-----------+--------------+ Subclavian                                          Not visualized +----------+------------+----------+---------+-----------+--------------+ Axillary                                            Not visualized +----------+------------+----------+---------+-----------+--------------+ Brachial      Full                 Yes       Yes                   +----------+------------+----------+---------+-----------+--------------+ Radial                                              Not visualized +----------+------------+----------+---------+-----------+--------------+ Ulnar                                               Not visualized +----------+------------+----------+---------+-----------+--------------+ Cephalic      None                                      Acute      +----------+------------+----------+---------+-----------+--------------+ Basilic  Not visualized +----------+------------+----------+---------+-----------+--------------+  Summary:  Right: Findings consistent with acute deep vein thrombosis involving the right axillary vein and right brachial veins.  Left: No evidence of deep vein thrombosis in the upper extremity involving the visualized veins of the left upper extremity. Findings consistent with acute superficial vein thrombosis involving the left cephalic vein. This was a limited study.  *See table(s) above for measurements and observations.   Diagnosing physician: Sherald Hess MD Electronically signed by Sherald Hess MD on 06/13/2018 at 2:55:47 PM.    Final      Medical Consultants:    None.  Anti-Infectives:   [place]  Subjective:    Ana Collins nonverbal, lethargic.  Objective:    Vitals:   06/14/18 1352 06/14/18 2117 06/14/18 2120 06/15/18 0116  BP: 119/81 (!) 104/52 (!) 135/95 132/80  Pulse: 91 (!) 59 60 62  Resp: (!) 24 20 20    Temp: 100.2 F (37.9 C) 98.7 F (37.1 C)  98.2 F (36.8 C)  TempSrc: Oral Oral  Oral  SpO2: 98% 100% 100%   Weight:      Height:        Intake/Output Summary (Last 24 hours) at 06/15/2018 0854 Last data filed at 06/15/2018 0415 Gross per 24 hour  Intake 3665.67 ml  Output -  Net 3665.67 ml   Filed Weights   06/09/18 2311  Weight: 84 kg    Exam: General exam: In no acute distress. Respiratory system: Good air movement and clear to auscultation. Cardiovascular system: S1 & S2 heard, RRR. No JVD.  Gastrointestinal system: Abdomen is nondistended, soft and nontender.  Central nervous system: Somnolent not able to follow commands cannot answer questions pupils are equally round and reactive face is symmetric strength is 4 out of 5 in all 4 extremities.   Extremities: No pedal edema. Skin: No rashes, lesions or ulcers.    Data Reviewed:    Labs: Basic Metabolic Panel: Recent Labs  Lab 06/09/18 1320 06/10/18 1145 06/11/18 1550 06/12/18 1210 06/13/18 1244 06/14/18 0647  NA 150* 156* 155* 154* 154* 154*  K 4.0 3.5 5.6* 3.9 3.5 3.9  CL 118* 125* 129* 124* 125* 126*  CO2 23 24 18* 21* 26 24  GLUCOSE 104* 111* 83 115* 115* 97  BUN <5* 5* <5* <5* <5* <5*  CREATININE 0.79 0.82 0.70 0.80 0.86 0.75  CALCIUM 9.1 8.3* 8.4* 8.7* 8.8* 8.7*  MG 1.9  --   --   --   --   --    GFR Estimated Creatinine Clearance: 76 mL/min (by C-G formula based on SCr of 0.75 mg/dL). Liver Function Tests: Recent Labs  Lab 06/09/18 1320  AST 65*  ALT 25  ALKPHOS 136*    BILITOT 0.9  PROT 8.3*  ALBUMIN 2.6*   No results for input(s): LIPASE, AMYLASE in the last 168 hours. Recent Labs  Lab 06/13/18 1244  AMMONIA 51*   Coagulation profile No results for input(s): INR, PROTIME in the last 168 hours.  CBC: Recent Labs  Lab 06/09/18 1320 06/10/18 1145  WBC 9.3 8.8  NEUTROABS 6.8 6.7  HGB 10.4* 10.1*  HCT 35.6* 34.3*  MCV 90.8 90.0  PLT PLATELET CLUMPS NOTED ON SMEAR, UNABLE TO ESTIMATE 103*   Cardiac Enzymes: Recent Labs  Lab 06/09/18 1320  TROPONINI <0.03   BNP (last 3 results) No results for input(s): PROBNP in the last 8760 hours. CBG: Recent Labs  Lab 06/12/18 0749  GLUCAP 86   D-Dimer: No results for input(s): DDIMER in  the last 72 hours. Hgb A1c: No results for input(s): HGBA1C in the last 72 hours. Lipid Profile: No results for input(s): CHOL, HDL, LDLCALC, TRIG, CHOLHDL, LDLDIRECT in the last 72 hours. Thyroid function studies: No results for input(s): TSH, T4TOTAL, T3FREE, THYROIDAB in the last 72 hours.  Invalid input(s): FREET3 Anemia work up: No results for input(s): VITAMINB12, FOLATE, FERRITIN, TIBC, IRON, RETICCTPCT in the last 72 hours. Sepsis Labs: Recent Labs  Lab 06/09/18 1320 06/09/18 1821 06/09/18 2230 06/10/18 1145  WBC 9.3  --   --  8.8  LATICACIDVEN  --  1.9 1.9  --    Microbiology Recent Results (from the past 240 hour(s))  Blood culture (routine x 2)     Status: None   Collection Time: 06/09/18  1:20 PM  Result Value Ref Range Status   Specimen Description BLOOD LEFT ANTECUBITAL  Final   Special Requests   Final    BOTTLES DRAWN AEROBIC ONLY Blood Culture adequate volume   Culture   Final    NO GROWTH 5 DAYS Performed at [facility], [address] [place], [place] [address]    Report Status 06/14/2018 FINAL  Final  Urine culture     Status: Abnormal   Collection Time: 06/09/18  2:29 PM  Result Value Ref Range Status   Specimen Description URINE, CLEAN CATCH  Final   Special  Requests   Final    NONE Performed at [facility], [address], [place], [place] [address]    Culture >=100,000 COLONIES/mL PROTEUS MIRABILIS (A)  Final   Report Status 06/11/2018 FINAL  Final   Organism ID, Bacteria PROTEUS MIRABILIS (A)  Final      Susceptibility   Proteus mirabilis - MIC*    AMPICILLIN 8 SENSITIVE Sensitive     CEFAZOLIN 8 SENSITIVE Sensitive     CEFTRIAXONE <=1 SENSITIVE Sensitive     CIPROFLOXACIN >=4 RESISTANT Resistant     GENTAMICIN 8 INTERMEDIATE Intermediate     IMIPENEM 2 SENSITIVE Sensitive     NITROFURANTOIN 256 RESISTANT Resistant     TRIMETH/SULFA >=320 RESISTANT Resistant     AMPICILLIN/SULBACTAM 4 SENSITIVE Sensitive     PIP/TAZO <=4 SENSITIVE Sensitive     * >=100,000 COLONIES/mL PROTEUS MIRABILIS  Blood culture (routine x 2)     Status: None   Collection Time: 06/09/18  6:10 PM  Result Value Ref Range Status   Specimen Description BLOOD LEFT HAND  Final   Special Requests   Final    AEROBIC BOTTLE ONLY Blood Culture results may not be optimal due to an inadequate volume of blood received in culture bottles   Culture   Final    NO GROWTH 5 DAYS Performed at [facility], [address] [place], [place] [address]    Report Status 06/14/2018 FINAL  Final     Medications:   . donepezil  10 mg Oral QHS  . PHENObarbital  65 mg Intravenous QHS  . senna  1 tablet Oral BID   Continuous Infusions: . ampicillin-sulbactam (UNASYN) IV 1.5 g (06/15/18 0650)  . dextrose 100 mL/hr at 06/15/18 0248  . lacosamide (VIMPAT) IV 200 mg (06/15/18 0503)     LOS: 5 days   Marinda Elk  [facility]  06/15/2018, 8:54 AM

## 2018-06-16 NOTE — Progress Notes (Signed)
[facility] PROGRESS NOTE    Progress Note  Ana Collins  OJJ:[id number] DOB: [birth year] DOA: 06/09/2018 PCP: Karna Dupes, MD     Brief Narrative:   Ana Collins is an 63 y.o. female past medical history significant for atrial fibrillation, previous stroke, who was on Eliquis until fall which prompted to discontinued the Eliquis as she had a subdural hematoma, recently treated for UTI on 06/07/2018 with Cipro congestive heart failure, seizures due to her strokes on Keppra, phenobarbital and Vimpat comes into the ED for active tonic-clonic seizures, in the ED she was started on IV Keppra and Lorazepam.  Assessment/Plan:   Seizure, late effect of stroke (HCC): Continue phenobarbital and Vimpat. Neurology recommended to discontinue Keppra. Patient is more awake today. We will contact social worker for skilled nursing facility placement.  Possible UTI: Urine culture was ordered and showed Proteus  with more than 100,000 colonies, she has completed her antibiotic course.  Hypernatremia: Continue D5W, is currently 154.  Hyperkalemia:  EKG showed no peaked T waves potassium improved today.  Chronic atrial fibrillation: Not a candidate for anticoagulation due to subdural hematoma, which she still present from November 2019 Rate controlled.  Left upper extremity swelling: Duplex ultrasound+ upper extremity DVT, discussed the case with neurosurgery he recommended against anticoagulation due to the high risk of rebleeding into her subdural hematoma.  DVT prophylaxis: SCD Family Communication:Son Disposition Plan/Barrier to D/C: snf when medically stable. Code Status:     Code Status Orders  (From admission, onward)         Start     Ordered   06/09/18 1635  Full code  Continuous     06/09/18 1637        Code Status History    Date Active Date Inactive Code Status Order ID Comments User Context   06/01/2018 0439 06/08/2018 0138 Full Code [id number]  John Giovanni,  MD ED        IV Access:    Peripheral IV   Procedures and diagnostic studies:   No results found.   Medical Consultants:    None.  Anti-Infectives:   [place]  Subjective:    Ana Collins is more awake today, and relate that she is hungry.  Objective:    Vitals:   06/15/18 1553 06/15/18 2051 06/15/18 2338 06/16/18 0339  BP: 111/68 (!) 92/52 134/81 132/78  Pulse: 60 (!) 59 60 (!) 59  Resp: 18 18 (!) 21 (!) 23  Temp: 99.2 F (37.3 C) 98.8 F (37.1 C) 98.5 F (36.9 C) 98.4 F (36.9 C)  TempSrc: Oral Oral Oral Oral  SpO2: 99% 100% 100% 100%  Weight:      Height:        Intake/Output Summary (Last 24 hours) at 06/16/2018 0811 Last data filed at 06/15/2018 1730 Gross per 24 hour  Intake 240 ml  Output -  Net 240 ml   Filed Weights   06/09/18 2311  Weight: 84 kg    Exam: General exam: In no acute distress. Respiratory system: Good air movement and clear to auscultation. Cardiovascular system: S1 & S2 heard, RRR. No JVD.  Gastrointestinal system: Abdomen is nondistended, soft and nontender.  Central nervous system: Awake today able to follow simple commands.  Moving all 4 extremities without any difficulties. Extremities: No pedal edema. Skin: No rashes, lesions or ulcers.    Data Reviewed:    Labs: Basic Metabolic Panel: Recent Labs  Lab 06/09/18 1320 06/10/18 1145 06/11/18 1550 06/12/18 1210 06/13/18 1244 06/14/18  0647  NA 150* 156* 155* 154* 154* 154*  K 4.0 3.5 5.6* 3.9 3.5 3.9  CL 118* 125* 129* 124* 125* 126*  CO2 23 24 18* 21* 26 24  GLUCOSE 104* 111* 83 115* 115* 97  BUN <5* 5* <5* <5* <5* <5*  CREATININE 0.79 0.82 0.70 0.80 0.86 0.75  CALCIUM 9.1 8.3* 8.4* 8.7* 8.8* 8.7*  MG 1.9  --   --   --   --   --    GFR Estimated Creatinine Clearance: 76 mL/min (by C-G formula based on SCr of 0.75 mg/dL). Liver Function Tests: Recent Labs  Lab 06/09/18 1320  AST 65*  ALT 25  ALKPHOS 136*  BILITOT 0.9  PROT 8.3*  ALBUMIN  2.6*   No results for input(s): LIPASE, AMYLASE in the last 168 hours. Recent Labs  Lab 06/13/18 1244  AMMONIA 51*   Coagulation profile No results for input(s): INR, PROTIME in the last 168 hours.  CBC: Recent Labs  Lab 06/09/18 1320 06/10/18 1145  WBC 9.3 8.8  NEUTROABS 6.8 6.7  HGB 10.4* 10.1*  HCT 35.6* 34.3*  MCV 90.8 90.0  PLT PLATELET CLUMPS NOTED ON SMEAR, UNABLE TO ESTIMATE 103*   Cardiac Enzymes: Recent Labs  Lab 06/09/18 1320  TROPONINI <0.03   BNP (last 3 results) No results for input(s): PROBNP in the last 8760 hours. CBG: Recent Labs  Lab 06/12/18 0749  GLUCAP 86   D-Dimer: No results for input(s): DDIMER in the last 72 hours. Hgb A1c: No results for input(s): HGBA1C in the last 72 hours. Lipid Profile: No results for input(s): CHOL, HDL, LDLCALC, TRIG, CHOLHDL, LDLDIRECT in the last 72 hours. Thyroid function studies: No results for input(s): TSH, T4TOTAL, T3FREE, THYROIDAB in the last 72 hours.  Invalid input(s): FREET3 Anemia work up: No results for input(s): VITAMINB12, FOLATE, FERRITIN, TIBC, IRON, RETICCTPCT in the last 72 hours. Sepsis Labs: Recent Labs  Lab 06/09/18 1320 06/09/18 1821 06/09/18 2230 06/10/18 1145  WBC 9.3  --   --  8.8  LATICACIDVEN  --  1.9 1.9  --    Microbiology Recent Results (from the past 240 hour(s))  Blood culture (routine x 2)     Status: None   Collection Time: 06/09/18  1:20 PM  Result Value Ref Range Status   Specimen Description BLOOD LEFT ANTECUBITAL  Final   Special Requests   Final    BOTTLES DRAWN AEROBIC ONLY Blood Culture adequate volume   Culture   Final    NO GROWTH 5 DAYS Performed at [facility], [address] [place], [place] [address]    Report Status 06/14/2018 FINAL  Final  Urine culture     Status: Abnormal   Collection Time: 06/09/18  2:29 PM  Result Value Ref Range Status   Specimen Description URINE, CLEAN CATCH  Final   Special Requests   Final    NONE Performed  at [facility], [address], [place], [place] [address]    Culture >=100,000 COLONIES/mL PROTEUS MIRABILIS (A)  Final   Report Status 06/11/2018 FINAL  Final   Organism ID, Bacteria PROTEUS MIRABILIS (A)  Final      Susceptibility   Proteus mirabilis - MIC*    AMPICILLIN 8 SENSITIVE Sensitive     CEFAZOLIN 8 SENSITIVE Sensitive     CEFTRIAXONE <=1 SENSITIVE Sensitive     CIPROFLOXACIN >=4 RESISTANT Resistant     GENTAMICIN 8 INTERMEDIATE Intermediate     IMIPENEM 2  SENSITIVE Sensitive     NITROFURANTOIN 256 RESISTANT Resistant     TRIMETH/SULFA >=320 RESISTANT Resistant     AMPICILLIN/SULBACTAM 4 SENSITIVE Sensitive     PIP/TAZO <=4 SENSITIVE Sensitive     * >=100,000 COLONIES/mL PROTEUS MIRABILIS  Blood culture (routine x 2)     Status: None   Collection Time: 06/09/18  6:10 PM  Result Value Ref Range Status   Specimen Description BLOOD LEFT HAND  Final   Special Requests   Final    AEROBIC BOTTLE ONLY Blood Culture results may not be optimal due to an inadequate volume of blood received in culture bottles   Culture   Final    NO GROWTH 5 DAYS Performed at [facility], [address] [place], [place] [id number]    Report Status 06/14/2018 FINAL  Final     Medications:   . donepezil  10 mg Oral QHS  . PHENObarbital  65 mg Intravenous QHS  . senna  1 tablet Oral BID   Continuous Infusions: . ampicillin-sulbactam (UNASYN) IV 1.5 g (06/16/18 0508)  . dextrose 75 mL/hr at 06/16/18 0317  . lacosamide (VIMPAT) IV 200 mg (06/16/18 0318)     LOS: 6 days   Marinda ElkAbraham Feliz Ortiz  [facility]  06/16/2018, 8:11 AM

## 2018-06-17 DIAGNOSIS — R4182 Altered mental status, unspecified: Secondary | ICD-10-CM

## 2018-06-17 MED ORDER — LACOSAMIDE 100 MG PO TABS: 200.0000 mg | ORAL_TABLET | Freq: Two times a day (BID) | ORAL | 1 refills | Status: AC

## 2018-06-17 MED ORDER — LORAZEPAM 0.5 MG PO TABS: 0.5000 mg | ORAL_TABLET | Freq: Two times a day (BID) | ORAL | 0 refills | Status: AC

## 2018-06-17 NOTE — Progress Notes (Signed)
Discharge to: [facility] Anticipated discharge date: 06/17/18 Family notified: Yes, son, by phone Transportation by: [facility]  Report #: [id number]  CSW signing off.  Blenda Nicely LCSW [phone]

## 2018-06-17 NOTE — Discharge Summary (Addendum)
Physician Discharge Summary  Ana Collins ZJQ:[id number] DOB: 01-Aug-1955 DOA: 06/09/2018  PCP: Karna Dupes, MD  Admit date: 06/09/2018 Discharge date: 06/17/2018  Admitted From: SNF Disposition:  SNF  Recommendations for Outpatient Follow-up:  1. Follow up with PCP in 1-2 weeks 2. She will go back to rehab with physical therapy, with PMT to follow. 3. Assist with feeding.  Home Health:No Equipment/Devices:None  Discharge Condition:stable CODE STATUS:Full Diet recommendation: Heart Healthy  Brief/Interim Summary: 63 y.o. female past medical history significant for atrial fibrillation, previous stroke, who was on Eliquis until fall which prompted to discontinued the Eliquis as she had a subdural hematoma, recently treated for UTI on 06/07/2018 with Cipro congestive heart failure, seizures due to her strokes on Keppra, phenobarbital and Vimpat comes into the ED for active tonic-clonic seizures, in the ED she was started on IV Keppra and Lorazepam.  Discharge Diagnoses:  Principal Problem:   Seizure, late effect of stroke (HCC) Active Problems:   Chronic subdural hematoma (HCC)   Hypernatremia   Abnormal urinalysis   Atrial fibrillation, chronic   Pacemaker   Fever   Seizure as late effect of cerebrovascular accident (CVA) (HCC)   Palliative care by specialist   Goals of care, counseling/discussion  Seizure with late effect of stroke/acute encephalopathy: Her seizures are likely worsened due to subdural as they have become more frequently. She is currently on Keppra phenobarbital and Vimpat. Neurology was consulted recommended to continue antiplatelet therapy. EEG was done that showed no signs of seizures. She then became quite sedated neurology recommended to decrease Keppra this could be contributing to her sleepiness. Her Vimpat was increased to 200 and she was continued on phenobarbital. Her mentation improved and she was discharged in stable condition.  Possible  UTI: Urine culture showed Proteus Mirabella's more than 100,000 colonies. On admission she was started on IV Rocephin she completed her course in-house.  Hypernatremia: This improved with D5W.  Hyper kalemia:  Presentations were stopped her potassium came down.  Chronic atrial fibrillation: Not a candidate for anticoagulation due to subdural hematoma. She remains rate controlled.  Left upper extremity swelling: Left upper extremity Doppler showed a DVT. The case was discussed with neurology and neurosurgery and risk and benefits were explained to the family and we recommended against anticoagulation due to the high risk of rebleeding into her subdural hematoma. Discharge Instructions  Discharge Instructions    Diet - low sodium heart healthy   Complete by:  As directed    Increase activity slowly   Complete by:  As directed      Allergies as of 06/17/2018      Reactions   Latex Other (See Comments)   ON MAR   Penicillins Other (See Comments)   Did it involve swelling of the face/tongue/throat, SOB, or low BP? Unknown Did it involve sudden or severe rash/hives, skin peeling, or any reaction on the inside of your mouth or nose? Unknown Did you need to seek medical attention at a hospital or doctor's office? Unknown When did it last happen?Unknown on MAR  If all above answers are "NO", may proceed with cephalosporin use.      Medication List    STOP taking these medications   levETIRAcetam 100 MG/ML solution Commonly known as:  KEPPRA     TAKE these medications   acetaminophen 325 MG tablet Commonly known as:  TYLENOL Take 650 mg by mouth 2 (two) times daily.   ANORO ELLIPTA IN Inhale 1 puff into the lungs daily.  B-complex with vitamin C tablet Take 1 tablet by mouth daily.   carboxymethylcellul-glycerin 0.5-0.9 % ophthalmic solution Commonly known as:  REFRESH OPTIVE Place 1 drop into both eyes 4 (four) times daily.   cloNIDine 0.1 MG tablet Commonly  known as:  CATAPRES Take 0.1 mg by mouth 3 (three) times daily.   donepezil 10 MG tablet Commonly known as:  ARICEPT Take 10 mg by mouth at bedtime.   furosemide 20 MG tablet Commonly known as:  LASIX Take 60 mg by mouth daily.   HEMOCYTE-PLUS PO Take 1 capsule by mouth 2 (two) times daily.   Lacosamide 100 MG Tabs Take 2 tablets (200 mg total) by mouth 2 (two) times daily. What changed:  how much to take   LORazepam 0.5 MG tablet Commonly known as:  ATIVAN Take 1 tablet (0.5 mg total) by mouth 2 (two) times daily.   losartan 25 MG tablet Commonly known as:  COZAAR Take 25 mg by mouth daily.   multivitamin with minerals Tabs tablet Take 1 tablet by mouth daily.   omeprazole 20 MG capsule Commonly known as:  PRILOSEC Take 20 mg by mouth daily.   ondansetron 8 MG tablet Commonly known as:  ZOFRAN Take by mouth every 8 (eight) hours as needed for nausea or vomiting.   PHENobarbital 64.8 MG tablet Commonly known as:  LUMINAL Take 64.8 mg by mouth at bedtime.   potassium chloride 10 MEQ tablet Commonly known as:  K-DUR Take 20 mEq by mouth daily.   pravastatin 80 MG tablet Commonly known as:  PRAVACHOL Take 80 mg by mouth daily.   RESOURCE 2.0 PO Take 1 Can by mouth 3 (three) times daily between meals.   sertraline 50 MG tablet Commonly known as:  ZOLOFT Take 50 mg by mouth daily.   sucralfate 1 g tablet Commonly known as:  CARAFATE Take 1 g by mouth 3 (three) times daily as needed Genella Rife(Gerd).       Allergies  Allergen Reactions  . Latex Other (See Comments)    ON MAR  . Penicillins Other (See Comments)    Did it involve swelling of the face/tongue/throat, SOB, or low BP? Unknown Did it involve sudden or severe rash/hives, skin peeling, or any reaction on the inside of your mouth or nose? Unknown Did you need to seek medical attention at a hospital or doctor's office? Unknown When did it last happen?Unknown on MAR  If all above answers are "NO",  may proceed with cephalosporin use.     Consultations:  Neurology   Procedures/Studies: Dg Chest 1 View  Result Date: 06/01/2018 CLINICAL DATA:  Hypotension and altered mental status. EXAM: CHEST  1 VIEW COMPARISON:  None. FINDINGS: Cardiomegaly with aortic atherosclerosis. Hilar prominence likely representing prominent pulmonary vasculature is noted likely representing stigmata of pulmonary hypertension. No acute pulmonary consolidation, effusion or pneumothorax. Left-sided pacemaker apparatus with leads in the right atrium and right ventricle are noted. IMPRESSION: Cardiomegaly with aortic atherosclerosis. No acute pulmonary disease. Electronically Signed   By: Tollie Ethavid  Kwon M.D.   On: 06/01/2018 01:11   Dg Chest 2 View  Result Date: 06/09/2018 CLINICAL DATA:  63 year old female EXAM: CHEST - 2 VIEW COMPARISON:  None. FINDINGS: Cardiomediastinal silhouette unchanged in size and contour. Low lung volumes with central vascular congestion. Interlobular septal thickening. Opacities at the lung bases obscuring the hemidiaphragms and blunting the costophrenic sulcus on the lateral view. No pneumothorax. Unchanged cardiac pacing device. IMPRESSION: Acute CHF with small pleural effusions. Electronically Signed  By: Gilmer Mor D.O.   On: 06/09/2018 14:34   Ct Head Wo Contrast  Result Date: 06/09/2018 CLINICAL DATA:  63 year old female with seizure like activity today. Recent subdural hematoma suspected. EXAM: CT HEAD WITHOUT CONTRAST TECHNIQUE: Contiguous axial images were obtained from the base of the skull through the vertex without intravenous contrast. COMPARISON:  06/02/2018 and earlier. FINDINGS: Brain: Continued evidence of an isodense right lateral extra-axial collection measuring 5 millimeters (series 5, image 37) stable over this series of recent exams. No associated midline shift or intracranial mass effect. Superimposed cystic encephalomalacia in the right temporal and parietal lobes and  confluent right greater than left cerebral white matter hypodensity with ex vacuo appearing enlargement of the right lateral ventricle. Stable gray-white matter differentiation throughout the brain. No cortically based acute infarct identified. Vascular: Calcified atherosclerosis at the skull base. No suspicious intracranial vascular hyperdensity. Skull: No acute osseous abnormality identified. Sinuses/Orbits: Mild to moderate mucosal thickening in the right frontal and posterior ethmoids is stable. Generally well pneumatized paranasal sinuses otherwise. Stable right mastoid effusion. Right tympanic cavity remains clear. Other: Leftward gaze deviation otherwise negative orbits. No acute scalp soft tissue findings. IMPRESSION: 1. Stable non contrast CT appearance of the brain since 06/01/2018. 2. Suspected isodense right lateral subdural hematoma measuring 5 mm. No intracranial mass effect. 3. Cystic appearing encephalomalacia in the right temporal and parietal lobes with confluent right greater than left cerebral white matter hypodensity. Electronically Signed   By: Odessa Fleming M.D.   On: 06/09/2018 14:39   Ct Head Wo Contrast  Result Date: 06/02/2018 CLINICAL DATA:  63 year old female presenting with hypotension and altered mental status found to have right side subdural hematoma on head CT. EXAM: CT HEAD WITHOUT CONTRAST TECHNIQUE: Contiguous axial images were obtained from the base of the skull through the vertex without intravenous contrast. COMPARISON:  06/01/2018. FINDINGS: Brain: There does seem to be an isodense right side subdural hematoma (coronal image 26) superimposed on multifocal areas of right hemisphere cystic encephalomalacia, and right lateral ventricle ex vacuo enlargement. Hematoma size is estimated at 4-5 millimeters, but there is no midline shift or significant intracranial mass effect. There is a superimposed small 5 millimeter parafalcine subdural hematoma suspected on series 3, image 25. In  the left hemisphere there is confluent white matter hypodensity. There is generalized cerebral volume loss. There is thickening of the pituitary infundibulum (coronal image 29) with no intra sellar or suprasellar mass effect. No definite acute cortically based infarcts; right PCA territory hypodensity is favored to be chronic in light of right occipital horn enlargement. No new intracranial blood products. Vascular: Calcified atherosclerosis at the skull base. No suspicious intracranial vascular hyperdensity. Skull: Hyperostosis, a normal variant. No skull fracture or acute osseous abnormality identified. Sinuses/Orbits: Right mastoid effusion and right frontal sinus bubbly opacity are stable. Tympanic cavities, left mastoids, and most of the other paranasal sinuses remain well pneumatized. Other: No acute orbit or scalp soft tissue finding. IMPRESSION: 1. Isodense right side subdural hematoma again suspected, 4-5 mm in thickness, with small 5 mm parafalcine subdural also. No intracranial mass effect.  No associated skull fracture. Multifocal encephalomalacia in the right hemisphere with ex vacuo ventricular enlargement. 2. Nonspecific thickening of the pituitary infundibulum. Query Diabetes Insipidus or Hypopituitarism. Electronically Signed   By: Odessa Fleming M.D.   On: 06/02/2018 14:26   Ct Head Wo Contrast  Result Date: 06/01/2018 CLINICAL DATA:  63 year old female with hypotension and altered mental status. EXAM: CT HEAD WITHOUT CONTRAST TECHNIQUE:  Contiguous axial images were obtained from the base of the skull through the vertex without intravenous contrast. COMPARISON:  None. FINDINGS: Brain: Acute right-sided subdural overlying the convexity the brain measuring up to 4.3 mm in thickness. Right parafalcine subdural hematoma is also noted measuring to 3.5 mm in thickness as well as the right tentorium measuring up to 1.4 mm. Remote appearing right temporal occipital infarct with adjacent encephalomalacia right  lateral ventricle. An age-indeterminate brainstem infarct is also seen. Hyperdensities along the cerebellopontine angles compatible choroid plexus calcifications the sagittal view. Moderate degree of small vessel ischemia is otherwise noted. Age related involutional changes of brain are noted. Vascular: No hyperdense vessel sign. Skull: No acute fracture or suspicious osseous lesions. Sinuses/Orbits: Right frontal, right maxillary and ethmoid sinus mucosal thickening. Intact orbits and globes. Other: None IMPRESSION: 1. Acute right-sided subdural hematomas as above along the convexity of the brain, falx and right tentorium measuring up to 4.3 mm in thickness. 2. Chronic moderate small vessel ischemic disease. 3. Chronic right temporal occipital lobe infarct with adjacent encephalomalacia the right lateral ventricle. 4. Age-indeterminate brainstem infarct. These results were called by telephone at the time of interpretation on 06/01/2018 at 2:00 am to Dr. Geoffery Lyons , who verbally acknowledged these results. Electronically Signed   By: Tollie Eth M.D.   On: 06/01/2018 02:04   US Renal  Result Date: 06/01/2018 CLINICAL DATA:  Acute kidney injury. EXAM: RENAL / URINARY TRACT ULTRASOUND COMPLETE COMPARISON:  None. FINDINGS: Right Kidney: Renal measurements: 12.3 x 5.6 x 7.4 cm = volume: 267 mL . Echogenicity within normal limits. No mass or hydronephrosis visualized. Left Kidney: Renal measurements: 10.5 x 4.7 x 4.4 cm = volume: 112 mL. Echogenicity within normal limits. No mass or hydronephrosis visualized. Bladder: Appears normal for degree of bladder distention. IMPRESSION: No significant renal abnormality seen. Electronically Signed   By: Lupita Raider, M.D.   On: 06/01/2018 07:22   Dg Swallowing Func-speech Pathology  Result Date: 06/12/2018 Objective Swallowing Evaluation: Type of Study: MBS-Modified Barium Swallow Study  Patient Details Name: Ana Collins MRN: [id number] Date of Birth: 16-Apr-1956 Today's  Date: 06/12/2018 Time: SLP Start Time (ACUTE ONLY): 1425 -SLP Stop Time (ACUTE ONLY): 1446 SLP Time Calculation (min) (ACUTE ONLY): 21 min Past Medical History: Past Medical History: Diagnosis Date . Anemia  . Atrial fibrillation (HCC)  . CHF (congestive heart failure) (HCC)   diastolic . CVD (cardiovascular disease)  . Dementia (HCC)   vascular . Epilepsy (HCC)  . GERD (gastroesophageal reflux disease)  . Heart disease   chronic ischemic . Hyperlipidemia  . Hypertension  . Pacemaker  . Traumatic subdural hemorrhage (HCC)   w/o LOC Past Surgical History: No past surgical history on file. HPI: Pt is a 63 year old female with past history of CVA, right subdural hematoma and epilepsy.  Patient already on 3 medications Keppra, phenobarbital and Vimpat. Admitted because she had a breakthrough seizure likely in the setting of urinary tract infection. On 2/11/2 patient seem more somnolent and was having quasi-rhythmic jerking/twitching right upper extremity. An EEG was obtained and was negative for epileptiform activity and there was no clinical correlate for right upper extremity movement. A dysphagia 1 diet with thin liquids was recommended on 06/10/18 but was downgraded to nectar thick liquids by nursing due to observance of signs of aspiration with thin liquids.   Subjective: pt awake in bed. daughter in law present Assessment / Plan / Recommendation CHL IP CLINICAL IMPRESSIONS 06/12/2018 Clinical Impression Pt presents with moderate  oropharyngeal dysphagia characterized by a moderate-severe oral transit delay, impaired bolus control which resulted in premature spillage, and a pharyngeal delay. No penetration/aspiration were noted during the study; however, considering her variable alertness combined with her oropharyngeal delay, her risk of aspiration before deglutition is high with thin liquids. It is therefore recommended that a dysphagia 1 diet with nectar thick liquids be continued and SLP will continue to follow pt.   SLP Visit Diagnosis Dysphagia, oropharyngeal phase (R13.12) Attention and concentration deficit following -- Frontal lobe and executive function deficit following -- Impact on safety and function Mild aspiration risk;Moderate aspiration risk   CHL IP TREATMENT RECOMMENDATION 06/12/2018 Treatment Recommendations Therapy as outlined in treatment plan below   Prognosis 06/12/2018 Prognosis for Safe Diet Advancement Fair Barriers to Reach Goals Cognitive deficits;Severity of deficits Barriers/Prognosis Comment -- CHL IP DIET RECOMMENDATION 06/12/2018 SLP Diet Recommendations Dysphagia 1 (Puree) solids;Nectar thick liquid Liquid Administration via Cup;Straw Medication Administration Whole meds with puree Compensations Minimize environmental distractions;Slow rate;Small sips/bites Postural Changes Remain semi-upright after after feeds/meals (Comment)   CHL IP OTHER RECOMMENDATIONS 06/12/2018 Recommended Consults -- Oral Care Recommendations Oral care BID Other Recommendations Have oral suction available   CHL IP FOLLOW UP RECOMMENDATIONS 06/12/2018 Follow up Recommendations Skilled Nursing facility   [facility] IP FREQUENCY AND DURATION 06/12/2018 Speech Therapy Frequency (ACUTE ONLY) min 2x/week Treatment Duration 2 weeks      CHL IP ORAL PHASE 06/12/2018 Oral Phase Impaired Oral - Pudding Teaspoon -- Oral - Pudding Cup -- Oral - Honey Teaspoon -- Oral - Honey Cup -- Oral - Nectar Teaspoon -- Oral - Nectar Cup -- Oral - Nectar Straw Delayed oral transit;Premature spillage Oral - Thin Teaspoon -- Oral - Thin Cup Delayed oral transit;Premature spillage Oral - Thin Straw -- Oral - Puree Delayed oral transit;Premature spillage Oral - Mech Soft Impaired mastication;Delayed oral transit;Premature spillage Oral - Regular -- Oral - Multi-Consistency -- Oral - Pill -- Oral Phase - Comment --  CHL IP PHARYNGEAL PHASE 06/12/2018 Pharyngeal Phase Impaired Pharyngeal- Pudding Teaspoon -- Pharyngeal -- Pharyngeal- Pudding Cup -- Pharyngeal --  Pharyngeal- Honey Teaspoon -- Pharyngeal -- Pharyngeal- Honey Cup -- Pharyngeal -- Pharyngeal- Nectar Teaspoon -- Pharyngeal -- Pharyngeal- Nectar Cup -- Pharyngeal -- Pharyngeal- Nectar Straw Delayed swallow initiation-vallecula Pharyngeal -- Pharyngeal- Thin Teaspoon -- Pharyngeal -- Pharyngeal- Thin Cup -- Pharyngeal -- Pharyngeal- Thin Straw Delayed swallow initiation-pyriform sinuses Pharyngeal -- Pharyngeal- Puree Delayed swallow initiation-vallecula Pharyngeal -- Pharyngeal- Mechanical Soft Delayed swallow initiation-vallecula Pharyngeal -- Pharyngeal- Regular -- Pharyngeal -- Pharyngeal- Multi-consistency -- Pharyngeal -- Pharyngeal- Pill -- Pharyngeal -- Pharyngeal Comment --  CHL IP CERVICAL ESOPHAGEAL PHASE 06/12/2018 Cervical Esophageal Phase WFL Pudding Teaspoon -- Pudding Cup -- Honey Teaspoon -- Honey Cup -- Nectar Teaspoon -- Nectar Cup -- Nectar Straw -- Thin Teaspoon -- Thin Cup -- Thin Straw -- Puree -- Mechanical Soft -- Regular -- Multi-consistency -- Pill -- Cervical Esophageal Comment -- Shanika I. Vear Clock, MS, CCC-SLP Acute Rehabilitation Services Office number [phone] Pager [phone] Scheryl Marten 06/12/2018, 4:22 PM              Vas [place] Upper Extremity Venous Duplex  Result Date: 06/13/2018 UPPER VENOUS STUDY  Indications: Swelling Limitations: Body habitus, poor ultrasound/tissue interface and patient contracted on left side. Performing Technologist: Gertie Fey MHA, RDMS, RVT, RDCS  Examination Guidelines: A complete evaluation includes B-mode imaging, spectral Doppler, color Doppler, and power Doppler as needed of all accessible portions of each vessel. Bilateral testing is considered an integral part  of a complete examination. Limited examinations for reoccurring indications may be performed as noted.  Right Findings: +----------+------------+----------+---------+-----------+--------------+ RIGHT     CompressiblePropertiesPhasicitySpontaneous   Summary      +----------+------------+----------+---------+-----------+--------------+ IJV           Full                 Yes       Yes                   +----------+------------+----------+---------+-----------+--------------+ Subclavian    Full                 Yes       Yes                   +----------+------------+----------+---------+-----------+--------------+ Axillary      None                           No         Acute      +----------+------------+----------+---------+-----------+--------------+ Brachial      None                           No         Acute      +----------+------------+----------+---------+-----------+--------------+ Radial        Full                                                 +----------+------------+----------+---------+-----------+--------------+ Ulnar                                               Not visualized +----------+------------+----------+---------+-----------+--------------+ Cephalic      Full                                                 +----------+------------+----------+---------+-----------+--------------+ Basilic                                             Not visualized +----------+------------+----------+---------+-----------+--------------+  Left Findings: +----------+------------+----------+---------+-----------+--------------+ LEFT      CompressiblePropertiesPhasicitySpontaneous   Summary     +----------+------------+----------+---------+-----------+--------------+ IJV                                                 Not visualized +----------+------------+----------+---------+-----------+--------------+ Subclavian                                          Not visualized +----------+------------+----------+---------+-----------+--------------+ Axillary  Not visualized +----------+------------+----------+---------+-----------+--------------+ Brachial       Full                 Yes       Yes                   +----------+------------+----------+---------+-----------+--------------+ Radial                                              Not visualized +----------+------------+----------+---------+-----------+--------------+ Ulnar                                               Not visualized +----------+------------+----------+---------+-----------+--------------+ Cephalic      None                                      Acute      +----------+------------+----------+---------+-----------+--------------+ Basilic                                             Not visualized +----------+------------+----------+---------+-----------+--------------+  Summary:  Right: Findings consistent with acute deep vein thrombosis involving the right axillary vein and right brachial veins.  Left: No evidence of deep vein thrombosis in the upper extremity involving the visualized veins of the left upper extremity. Findings consistent with acute superficial vein thrombosis involving the left cephalic vein. This was a limited study.  *See table(s) above for measurements and observations.  Diagnosing physician: Sherald Hess MD Electronically signed by Sherald Hess MD on 06/13/2018 at 2:55:47 PM.    Final     Subjective: No complains  Discharge Exam: Vitals:   06/17/18 0338 06/17/18 0935  BP: (!) 147/67 (!) 142/69  Pulse: (!) 115 61  Resp:  (!) 24  Temp: (!) 97.5 F (36.4 C) 98 F (36.7 C)  SpO2: 94% 98%     General: Pt is alert, awake, not in acute distress Cardiovascular: RRR, S1/S2 +, no rubs, no gallops Respiratory: CTA bilaterally, no wheezing, no rhonchi Abdominal: Soft, NT, ND, bowel sounds + Extremities: no edema, no cyanosis    The results of significant diagnostics from this hospitalization (including imaging, microbiology, ancillary and laboratory) are listed below for reference.     Microbiology: Recent Results  (from the past 240 hour(s))  Blood culture (routine x 2)     Status: None   Collection Time: 06/09/18  1:20 PM  Result Value Ref Range Status   Specimen Description BLOOD LEFT ANTECUBITAL  Final   Special Requests   Final    BOTTLES DRAWN AEROBIC ONLY Blood Culture adequate volume   Culture   Final    NO GROWTH 5 DAYS Performed at [facility], [address] [place], [place] [address]    Report Status 06/14/2018 FINAL  Final  Urine culture     Status: Abnormal   Collection Time: 06/09/18  2:29 PM  Result Value Ref Range Status   Specimen Description URINE, CLEAN CATCH  Final   Special Requests   Final    NONE Performed at [facility], [address] [place], [place] [address]    Culture >=100,000 COLONIES/mL PROTEUS MIRABILIS (A)  Final   Report Status 06/11/2018 FINAL  Final   Organism ID, Bacteria PROTEUS MIRABILIS (A)  Final      Susceptibility   Proteus mirabilis - MIC*    AMPICILLIN 8 SENSITIVE Sensitive     CEFAZOLIN 8 SENSITIVE Sensitive     CEFTRIAXONE <=1 SENSITIVE Sensitive     CIPROFLOXACIN >=4 RESISTANT Resistant     GENTAMICIN 8 INTERMEDIATE Intermediate     IMIPENEM 2 SENSITIVE Sensitive     NITROFURANTOIN 256 RESISTANT Resistant     TRIMETH/SULFA >=320 RESISTANT Resistant     AMPICILLIN/SULBACTAM 4 SENSITIVE Sensitive     PIP/TAZO <=4 SENSITIVE Sensitive     * >=100,000 COLONIES/mL PROTEUS MIRABILIS  Blood culture (routine x 2)     Status: None   Collection Time: 06/09/18  6:10 PM  Result Value Ref Range Status   Specimen Description BLOOD LEFT HAND  Final   Special Requests   Final    AEROBIC BOTTLE ONLY Blood Culture results may not be optimal due to an inadequate volume of blood received in culture bottles   Culture   Final    NO GROWTH 5 DAYS Performed at [facility], [address], [place], [place] [address]    Report Status 06/14/2018 FINAL  Final     Labs: BNP (last 3 results) Recent Labs    06/09/18 1320  BNP 148.5*    Basic Metabolic Panel: Recent Labs  Lab 06/10/18 1145 06/11/18 1550 06/12/18 1210 06/13/18 1244 06/14/18 0647  NA 156* 155* 154* 154* 154*  K 3.5 5.6* 3.9 3.5 3.9  CL 125* 129* 124* 125* 126*  CO2 24 18* 21* 26 24  GLUCOSE 111* 83 115* 115* 97  BUN 5* <5* <5* <5* <5*  CREATININE 0.82 0.70 0.80 0.86 0.75  CALCIUM 8.3* 8.4* 8.7* 8.8* 8.7*   Liver Function Tests: No results for input(s): AST, ALT, ALKPHOS, BILITOT, PROT, ALBUMIN in the last 168 hours. No results for input(s): LIPASE, AMYLASE in the last 168 hours. Recent Labs  Lab 06/13/18 1244  AMMONIA 51*   CBC: Recent Labs  Lab 06/10/18 1145  WBC 8.8  NEUTROABS 6.7  HGB 10.1*  HCT 34.3*  MCV 90.0  PLT 103*   Cardiac Enzymes: No results for input(s): CKTOTAL, CKMB, CKMBINDEX, TROPONINI in the last 168 hours. BNP: Invalid input(s): POCBNP CBG: Recent Labs  Lab 06/12/18 0749  GLUCAP 86   D-Dimer No results for input(s): DDIMER in the last 72 hours. Hgb A1c No results for input(s): HGBA1C in the last 72 hours. Lipid Profile No results for input(s): CHOL, HDL, LDLCALC, TRIG, CHOLHDL, LDLDIRECT in the last 72 hours. Thyroid function studies No results for input(s): TSH, T4TOTAL, T3FREE, THYROIDAB in the last 72 hours.  Invalid input(s): FREET3 Anemia work up No results for input(s): VITAMINB12, FOLATE, FERRITIN, TIBC, IRON, RETICCTPCT in the last 72 hours. Urinalysis    Component Value Date/Time   COLORURINE YELLOW 06/09/2018 1429   APPEARANCEUR CLOUDY (A) 06/09/2018 1429   LABSPEC 1.011 06/09/2018 1429   PHURINE 7.0 06/09/2018 1429   GLUCOSEU NEGATIVE 06/09/2018 1429   HGBUR NEGATIVE 06/09/2018 1429   BILIRUBINUR NEGATIVE 06/09/2018 1429   KETONESUR NEGATIVE 06/09/2018 1429   PROTEINUR NEGATIVE 06/09/2018 1429   NITRITE NEGATIVE 06/09/2018 1429   LEUKOCYTESUR LARGE (A) 06/09/2018 1429   Sepsis Labs Invalid input(s): PROCALCITONIN,  WBC,  LACTICIDVEN Microbiology Recent Results (from the  past  240 hour(s))  Blood culture (routine x 2)     Status: None   Collection Time: 06/09/18  1:20 PM  Result Value Ref Range Status   Specimen Description BLOOD LEFT ANTECUBITAL  Final   Special Requests   Final    BOTTLES DRAWN AEROBIC ONLY Blood Culture adequate volume   Culture   Final    NO GROWTH 5 DAYS Performed at [facility], [address] [place], [place] [address]    Report Status 06/14/2018 FINAL  Final  Urine culture     Status: Abnormal   Collection Time: 06/09/18  2:29 PM  Result Value Ref Range Status   Specimen Description URINE, CLEAN CATCH  Final   Special Requests   Final    NONE Performed at [facility], [address] [place], [place] [address]    Culture >=100,000 COLONIES/mL PROTEUS MIRABILIS (A)  Final   Report Status 06/11/2018 FINAL  Final   Organism ID, Bacteria PROTEUS MIRABILIS (A)  Final      Susceptibility   Proteus mirabilis - MIC*    AMPICILLIN 8 SENSITIVE Sensitive     CEFAZOLIN 8 SENSITIVE Sensitive     CEFTRIAXONE <=1 SENSITIVE Sensitive     CIPROFLOXACIN >=4 RESISTANT Resistant     GENTAMICIN 8 INTERMEDIATE Intermediate     IMIPENEM 2 SENSITIVE Sensitive     NITROFURANTOIN 256 RESISTANT Resistant     TRIMETH/SULFA >=320 RESISTANT Resistant     AMPICILLIN/SULBACTAM 4 SENSITIVE Sensitive     PIP/TAZO <=4 SENSITIVE Sensitive     * >=100,000 COLONIES/mL PROTEUS MIRABILIS  Blood culture (routine x 2)     Status: None   Collection Time: 06/09/18  6:10 PM  Result Value Ref Range Status   Specimen Description BLOOD LEFT HAND  Final   Special Requests   Final    AEROBIC BOTTLE ONLY Blood Culture results may not be optimal due to an inadequate volume of blood received in culture bottles   Culture   Final    NO GROWTH 5 DAYS Performed at [facility], [address] [place], [place] [address]    Report Status 06/14/2018 FINAL  Final     Time coordinating discharge: 40 minutes  SIGNED:   Marinda ElkAbraham Feliz Ortiz, MD  [facility]

## 2018-06-17 NOTE — Care Management Important Message (Signed)
Important Message  Patient Details  Name: Brucie Romanoski MRN: [id number] Date of Birth: 1955/12/16   Medicare Important Message Given:  Yes    Karthika Glasper [place] 06/17/2018, 3:38 PM

## 2018-06-17 NOTE — NC FL2 (Signed)
[place] MEDICAID FL2 LEVEL OF CARE SCREENING TOOL     IDENTIFICATION  Patient Name: Ana Collins Birthdate: February 04, 1956 Sex: female Admission Date (Current Location): 06/09/2018  [facility] and [place] Number:  Producer, television/film/video and Address:  [facility] [place]. [facility], [address], [place], [place] [address]      Provider Number: [id number]  Attending Physician Name and Address:  Marinda Elk, MD  Relative Name and Phone Number:       Current Level of Care: Hospital Recommended Level of Care: Skilled Nursing Facility Prior Approval Number:    Date Approved/Denied:   PASRR Number:    Discharge Plan: SNF    Current Diagnoses: Patient Active Problem List   Diagnosis Date Noted  . Goals of care, counseling/discussion   . Palliative care by specialist   . 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 Abnormal urinalysis 06/09/2018  . Seizure (HCC) 06/09/2018  . Atrial fibrillation, chronic 06/09/2018  . Pacemaker 06/09/2018  . Seizure, late effect of stroke (HCC) 06/09/2018  . Fever 06/09/2018  . Chronic subdural hematoma (HCC) 06/01/2018  . UTI (urinary tract infection) 06/01/2018  . Hypernatremia 06/01/2018  . Acute encephalopathy 06/01/2018  . Hypotension 06/01/2018  . AKI (acute kidney injury) (HCC) 06/01/2018    Orientation RESPIRATION BLADDER Height & Weight     Self  O2(see DC summary) Incontinent Weight: 185 lb 3 oz (84 kg) Height:  5\' 5"  (165.1 cm)  BEHAVIORAL SYMPTOMS/MOOD NEUROLOGICAL BOWEL NUTRITION STATUS    Convulsions/Seizures Incontinent Diet(see DC summary)  AMBULATORY STATUS COMMUNICATION OF NEEDS Skin   Extensive Assist Verbally Normal                       Personal Care Assistance Level of Assistance  Bathing, Feeding, Dressing Bathing Assistance: Maximum assistance Feeding assistance: Maximum assistance Dressing Assistance: Maximum assistance     Functional Limitations Info   Sight, Hearing, Speech Sight Info: Adequate Hearing Info: Adequate Speech Info: Impaired(incomprehensible)    SPECIAL CARE FACTORS FREQUENCY  PT (By licensed PT)     PT Frequency: 3x/wk       Speech Therapy Frequency: 3x/wk      Contractures Contractures Info: Not present    Additional Factors Info  Code Status, Allergies, Psychotropic Code Status Info: Full Allergies Info: Latex, Penicillins Psychotropic Info: Aricept 10mg  daily at bed         Current Medications (06/17/2018):  This is the current hospital active medication list Current Facility-Administered Medications  Medication Dose Route Frequency Provider Last Rate Last Dose  . acetaminophen (TYLENOL) tablet 650 mg  650 mg Oral Q4H PRN Lahoma Crocker, MD   650 mg at 06/16/18 2347   Or  . acetaminophen (TYLENOL) suppository 650 mg  650 mg Rectal Q4H PRN Lahoma Crocker, MD   650 mg at 06/14/18 1407  . dextrose 5 % solution   Intravenous Continuous Marinda Elk, MD 75 mL/hr at 06/17/18 0034    . donepezil (ARICEPT) tablet 10 mg  10 mg Oral QHS Ulice Dash, PA-C   10 mg at 06/16/18 2116  . lacosamide (VIMPAT) 200 mg in sodium chloride 0.9 % 25 mL IVPB  200 mg Intravenous Q12H Marinda Elk, MD 90 mL/hr at 06/17/18 0459 200 mg at 06/17/18 0459  . LORazepam (ATIVAN) injection 1-2 mg  1-2 mg Intravenous Q2H PRN Lahoma Crocker, MD   2 mg at 06/13/18 1532  .  PHENObarbital (LUMINAL) injection 65 mg  65 mg Intravenous QHS Marinda Elk, MD   65 mg at 06/16/18 2116  . RESOURCE THICKENUP CLEAR   Oral PRN Marzetta Board, MD      . senna (SENOKOT) tablet 8.6 mg  1 tablet Oral BID Lahoma Crocker, MD   8.6 mg at 06/17/18 0941  . sodium chloride flush (NS) 0.9 % injection 10-40 mL  10-40 mL Intracatheter PRN Lahoma Crocker, MD   20 mL at 06/12/18 0125     Discharge Medications: Please see discharge summary for a list of discharge medications.  Relevant Imaging  Results:  Relevant Lab Results:   Additional Information SS#: [id number]; Palliative to follow at [facility]  [facility], LCSW

## 2018-06-17 NOTE — Progress Notes (Signed)
IV and tele discontinued without complication. Report called to RN Wilkie Aye at [facility], Pt discharged from facility via [facility].

## 2018-06-17 NOTE — NC FL2 (Signed)
[place] MEDICAID FL2 LEVEL OF CARE SCREENING TOOL     IDENTIFICATION  Patient Name: Ana Collins Birthdate: 05/22/1955 Sex: female Admission Date (Current Location): 06/09/2018  [facility] and [place] Number:  Producer, television/film/video and Address:  [facility] [place]. [facility], [address] [place], [place] [address]      Provider Number: [id number]  Attending Physician Name and Address:  Marinda Elk, MD  Relative Name and Phone Number:       Current Level of Care: Hospital Recommended Level of Care: Skilled Nursing Facility Prior Approval Number:    Date Approved/Denied:   PASRR Number:    Discharge Plan: SNF    Current Diagnoses: Patient Active Problem List   Diagnosis Date Noted  . Goals of care, counseling/discussion   . Palliative care by specialist   . 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 Abnormal urinalysis 06/09/2018  . Seizure (HCC) 06/09/2018  . Atrial fibrillation, chronic 06/09/2018  . Pacemaker 06/09/2018  . Seizure, late effect of stroke (HCC) 06/09/2018  . Fever 06/09/2018  . Chronic subdural hematoma (HCC) 06/01/2018  . UTI (urinary tract infection) 06/01/2018  . Hypernatremia 06/01/2018  . Acute encephalopathy 06/01/2018  . Hypotension 06/01/2018  . AKI (acute kidney injury) (HCC) 06/01/2018    Orientation RESPIRATION BLADDER Height & Weight     Self  O2(see DC summary) Incontinent Weight: 185 lb 3 oz (84 kg) Height:  5\' 5"  (165.1 cm)  BEHAVIORAL SYMPTOMS/MOOD NEUROLOGICAL BOWEL NUTRITION STATUS    Convulsions/Seizures Incontinent Diet(see DC summary)  AMBULATORY STATUS COMMUNICATION OF NEEDS Skin   Extensive Assist Verbally Normal                       Personal Care Assistance Level of Assistance  Bathing, Feeding, Dressing Bathing Assistance: Maximum assistance Feeding assistance: Maximum assistance Dressing Assistance: Maximum assistance     Functional Limitations Info   Sight, Hearing, Speech Sight Info: Adequate Hearing Info: Adequate Speech Info: Impaired(incomprehensible)    SPECIAL CARE FACTORS FREQUENCY  Speech therapy             Speech Therapy Frequency: 3x/wk      Contractures Contractures Info: Not present    Additional Factors Info  Code Status, Allergies, Psychotropic Code Status Info: Full Allergies Info: Latex, Penicillins Psychotropic Info: Aricept 10mg  daily at bed         Current Medications (06/17/2018):  This is the current hospital active medication list Current Facility-Administered Medications  Medication Dose Route Frequency Provider Last Rate Last Dose  . acetaminophen (TYLENOL) tablet 650 mg  650 mg Oral Q4H PRN Lahoma Crocker, MD   650 mg at 06/16/18 2347   Or  . acetaminophen (TYLENOL) suppository 650 mg  650 mg Rectal Q4H PRN Lahoma Crocker, MD   650 mg at 06/14/18 1407  . dextrose 5 % solution   Intravenous Continuous Marinda Elk, MD 75 mL/hr at 06/17/18 0034    . donepezil (ARICEPT) tablet 10 mg  10 mg Oral QHS Ulice Dash, PA-C   10 mg at 06/16/18 2116  . lacosamide (VIMPAT) 200 mg in sodium chloride 0.9 % 25 mL IVPB  200 mg Intravenous Q12H Marinda Elk, MD 90 mL/hr at 06/17/18 0459 200 mg at 06/17/18 0459  . LORazepam (ATIVAN) injection 1-2 mg  1-2 mg Intravenous Q2H PRN Lahoma Crocker, MD   2 mg at 06/13/18 1532  . PHENObarbital (LUMINAL) injection  65 mg  65 mg Intravenous QHS Marinda Elk, MD   65 mg at 06/16/18 2116  . RESOURCE THICKENUP CLEAR   Oral PRN Marzetta Board, MD      . senna (SENOKOT) tablet 8.6 mg  1 tablet Oral BID Lahoma Crocker, MD   8.6 mg at 06/17/18 0941  . sodium chloride flush (NS) 0.9 % injection 10-40 mL  10-40 mL Intracatheter PRN Lahoma Crocker, MD   20 mL at 06/12/18 0125     Discharge Medications: Please see discharge summary for a list of discharge medications.  Relevant Imaging Results:  Relevant Lab  Results:   Additional Information SS#: [id number]; Palliative to follow at [facility]  [facility], LCSW

## 2018-06-24 ENCOUNTER — Emergency Department (HOSPITAL_COMMUNITY): Payer: Medicare Other

## 2018-06-24 ENCOUNTER — Inpatient Hospital Stay (HOSPITAL_COMMUNITY): Payer: Medicare Other

## 2018-06-24 ENCOUNTER — Inpatient Hospital Stay (HOSPITAL_COMMUNITY)
Admission: EM | Admit: 2018-06-24 | Discharge: 2018-06-30 | DRG: 100 | Disposition: E | Payer: Medicare Other | Attending: Pulmonary Disease | Admitting: Pulmonary Disease

## 2018-06-24 DIAGNOSIS — R4182 Altered mental status, unspecified: Secondary | ICD-10-CM

## 2018-06-24 DIAGNOSIS — R569 Unspecified convulsions: Secondary | ICD-10-CM | POA: Diagnosis not present

## 2018-06-24 DIAGNOSIS — J9601 Acute respiratory failure with hypoxia: Secondary | ICD-10-CM | POA: Diagnosis not present

## 2018-06-24 DIAGNOSIS — R578 Other shock: Secondary | ICD-10-CM | POA: Diagnosis present

## 2018-06-24 DIAGNOSIS — Z823 Family history of stroke: Secondary | ICD-10-CM

## 2018-06-24 DIAGNOSIS — G4089 Other seizures: Secondary | ICD-10-CM | POA: Diagnosis present

## 2018-06-24 DIAGNOSIS — N390 Urinary tract infection, site not specified: Secondary | ICD-10-CM | POA: Diagnosis present

## 2018-06-24 DIAGNOSIS — I2510 Atherosclerotic heart disease of native coronary artery without angina pectoris: Secondary | ICD-10-CM | POA: Diagnosis present

## 2018-06-24 DIAGNOSIS — G40901 Epilepsy, unspecified, not intractable, with status epilepticus: Principal | ICD-10-CM

## 2018-06-24 DIAGNOSIS — Z86718 Personal history of other venous thrombosis and embolism: Secondary | ICD-10-CM

## 2018-06-24 DIAGNOSIS — E872 Acidosis: Secondary | ICD-10-CM | POA: Diagnosis present

## 2018-06-24 DIAGNOSIS — Z978 Presence of other specified devices: Secondary | ICD-10-CM

## 2018-06-24 DIAGNOSIS — I482 Chronic atrial fibrillation, unspecified: Secondary | ICD-10-CM | POA: Diagnosis present

## 2018-06-24 DIAGNOSIS — F0390 Unspecified dementia without behavioral disturbance: Secondary | ICD-10-CM | POA: Diagnosis present

## 2018-06-24 DIAGNOSIS — E785 Hyperlipidemia, unspecified: Secondary | ICD-10-CM | POA: Diagnosis present

## 2018-06-24 DIAGNOSIS — I69354 Hemiplegia and hemiparesis following cerebral infarction affecting left non-dominant side: Secondary | ICD-10-CM | POA: Diagnosis not present

## 2018-06-24 DIAGNOSIS — J9691 Respiratory failure, unspecified with hypoxia: Secondary | ICD-10-CM | POA: Diagnosis present

## 2018-06-24 DIAGNOSIS — G9340 Encephalopathy, unspecified: Secondary | ICD-10-CM | POA: Diagnosis not present

## 2018-06-24 DIAGNOSIS — R579 Shock, unspecified: Secondary | ICD-10-CM

## 2018-06-24 DIAGNOSIS — J189 Pneumonia, unspecified organism: Secondary | ICD-10-CM | POA: Diagnosis present

## 2018-06-24 DIAGNOSIS — Z79899 Other long term (current) drug therapy: Secondary | ICD-10-CM

## 2018-06-24 DIAGNOSIS — K219 Gastro-esophageal reflux disease without esophagitis: Secondary | ICD-10-CM | POA: Diagnosis present

## 2018-06-24 DIAGNOSIS — R6889 Other general symptoms and signs: Secondary | ICD-10-CM

## 2018-06-24 DIAGNOSIS — I5030 Unspecified diastolic (congestive) heart failure: Secondary | ICD-10-CM | POA: Diagnosis present

## 2018-06-24 DIAGNOSIS — Z66 Do not resuscitate: Secondary | ICD-10-CM | POA: Diagnosis present

## 2018-06-24 DIAGNOSIS — I469 Cardiac arrest, cause unspecified: Secondary | ICD-10-CM

## 2018-06-24 DIAGNOSIS — I110 Hypertensive heart disease with heart failure: Secondary | ICD-10-CM | POA: Diagnosis present

## 2018-06-24 DIAGNOSIS — J690 Pneumonitis due to inhalation of food and vomit: Secondary | ICD-10-CM | POA: Diagnosis not present

## 2018-06-24 DIAGNOSIS — G92 Toxic encephalopathy: Secondary | ICD-10-CM

## 2018-06-24 LAB — CBC WITH DIFFERENTIAL/PLATELET
Abs Immature Granulocytes: 0.04 10*3/uL (ref 0.00–0.07)
Basophils Absolute: 0 10*3/uL (ref 0.0–0.1)
Basophils Relative: 1 %
EOS ABS: 0.3 10*3/uL (ref 0.0–0.5)
Eosinophils Relative: 4 %
HCT: 38.9 % (ref 36.0–46.0)
Hemoglobin: 11.5 g/dL — ABNORMAL LOW (ref 12.0–15.0)
Immature Granulocytes: 1 %
Lymphocytes Relative: 26 %
Lymphs Abs: 1.7 10*3/uL (ref 0.7–4.0)
MCH: 27.4 pg (ref 26.0–34.0)
MCHC: 29.6 g/dL — ABNORMAL LOW (ref 30.0–36.0)
MCV: 92.6 fL (ref 80.0–100.0)
Monocytes Absolute: 0.8 10*3/uL (ref 0.1–1.0)
Monocytes Relative: 12 %
Neutro Abs: 3.6 10*3/uL (ref 1.7–7.7)
Neutrophils Relative %: 56 %
Platelets: 174 10*3/uL (ref 150–400)
RBC: 4.2 MIL/uL (ref 3.87–5.11)
RDW: 18.9 % — AB (ref 11.5–15.5)
WBC: 6.3 10*3/uL (ref 4.0–10.5)
nRBC: 0 % (ref 0.0–0.2)

## 2018-06-24 LAB — COMPREHENSIVE METABOLIC PANEL
ALT: 24 U/L (ref 0–44)
AST: 75 U/L — AB (ref 15–41)
Albumin: 2.3 g/dL — ABNORMAL LOW (ref 3.5–5.0)
Alkaline Phosphatase: 145 U/L — ABNORMAL HIGH (ref 38–126)
Anion gap: 10 (ref 5–15)
BUN: 6 mg/dL — ABNORMAL LOW (ref 8–23)
CO2: 25 mmol/L (ref 22–32)
Calcium: 8.3 mg/dL — ABNORMAL LOW (ref 8.9–10.3)
Chloride: 107 mmol/L (ref 98–111)
Creatinine, Ser: 0.82 mg/dL (ref 0.44–1.00)
GFR calc Af Amer: >60 mL/min (ref >60)
Glucose, Bld: 100 mg/dL — ABNORMAL HIGH (ref 70–99)
Potassium: 3.8 mmol/L (ref 3.5–5.1)
Sodium: 142 mmol/L (ref 135–145)
Total Bilirubin: 0.9 mg/dL (ref 0.3–1.2)
Total Protein: 7.6 g/dL (ref 6.5–8.1)

## 2018-06-24 LAB — URINALYSIS, ROUTINE W REFLEX MICROSCOPIC
Bilirubin Urine: NEGATIVE
Glucose, UA: NEGATIVE mg/dL
HGB URINE DIPSTICK: NEGATIVE
Ketones, ur: NEGATIVE mg/dL
Nitrite: NEGATIVE
Protein, ur: NEGATIVE mg/dL
Specific Gravity, Urine: <1.005 — ABNORMAL LOW (ref 1.005–1.030)
pH: 6.5 (ref 5.0–8.0)

## 2018-06-24 LAB — POCT I-STAT 7, (LYTES, BLD GAS, ICA,H+H)
Acid-base deficit: 6 mmol/L — ABNORMAL HIGH (ref 0.0–2.0)
Acid-base deficit: 8 mmol/L — ABNORMAL HIGH (ref 0.0–2.0)
Bicarbonate: 19.7 mmol/L — ABNORMAL LOW (ref 20.0–28.0)
Bicarbonate: 18.6 mmol/L — ABNORMAL LOW (ref 20.0–28.0)
Calcium, Ion: 1.09 mmol/L — ABNORMAL LOW (ref 1.15–1.40)
Calcium, Ion: 1.11 mmol/L — ABNORMAL LOW (ref 1.15–1.40)
HCT: 33 % — ABNORMAL LOW (ref 36.0–46.0)
HCT: 35 % — ABNORMAL LOW (ref 36.0–46.0)
HEMOGLOBIN: 11.2 g/dL — AB (ref 12.0–15.0)
Hemoglobin: 11.9 g/dL — ABNORMAL LOW (ref 12.0–15.0)
O2 SAT: 93 %
O2 Saturation: 87 %
Patient temperature: 99
Patient temperature: 99
Potassium: 3.1 mmol/L — ABNORMAL LOW (ref 3.5–5.1)
Potassium: 3.3 mmol/L — ABNORMAL LOW (ref 3.5–5.1)
SODIUM: 146 mmol/L — AB (ref 135–145)
Sodium: 148 mmol/L — ABNORMAL HIGH (ref 135–145)
TCO2: 21 mmol/L — ABNORMAL LOW (ref 22–32)
TCO2: 20 mmol/L — ABNORMAL LOW (ref 22–32)
pCO2 arterial: 38.1 mmHg (ref 32.0–48.0)
pCO2 arterial: 43.5 mmHg (ref 32.0–48.0)
pH, Arterial: 7.322 — ABNORMAL LOW (ref 7.350–7.450)
pH, Arterial: 7.239 — ABNORMAL LOW (ref 7.350–7.450)
pO2, Arterial: 75 mmHg — ABNORMAL LOW (ref 83.0–108.0)
pO2, Arterial: 63 mmHg — ABNORMAL LOW (ref 83.0–108.0)

## 2018-06-24 LAB — URINALYSIS, MICROSCOPIC (REFLEX)

## 2018-06-24 LAB — CBG MONITORING, ED: Glucose-Capillary: 83 mg/dL (ref 70–99)

## 2018-06-24 LAB — LACTIC ACID, PLASMA: Lactic Acid, Venous: 4.2 mmol/L (ref 0.5–1.9)

## 2018-06-24 LAB — PHENOBARBITAL LEVEL: PHENOBARBITAL: 16.1 ug/mL (ref 15.0–30.0)

## 2018-06-24 MED ORDER — DONEPEZIL HCL 10 MG PO TABS: 10.0000 mg | ORAL_TABLET | Freq: Every day | ORAL | Status: DC

## 2018-06-24 MED ORDER — SODIUM CHLORIDE 0.9 % IV SOLN
INTRAVENOUS | Status: DC
  Administered 2018-06-24: 17:00:00 via INTRAVENOUS

## 2018-06-24 MED ORDER — LORAZEPAM 2 MG/ML IJ SOLN
2.0000 mg | Freq: Once | INTRAMUSCULAR | Status: AC
  Administered 2018-06-24: 2 mg via INTRAVENOUS
  Filled 2018-06-24: qty 1

## 2018-06-24 MED ORDER — LACOSAMIDE 200 MG PO TABS: 200.0000 mg | ORAL_TABLET | Freq: Two times a day (BID) | ORAL | Status: DC

## 2018-06-24 MED ORDER — LEVETIRACETAM IN NACL 1000 MG/100ML IV SOLN
1000.0000 mg | Freq: Once | INTRAVENOUS | Status: AC
  Administered 2018-06-24: 1000 mg via INTRAVENOUS
  Filled 2018-06-24: qty 100

## 2018-06-24 MED ORDER — ETOMIDATE 2 MG/ML IV SOLN
INTRAVENOUS | Status: AC | PRN
  Administered 2018-06-24: 20 mg via INTRAVENOUS

## 2018-06-24 MED ORDER — B COMPLEX-C PO TABS: 1.0000 | ORAL_TABLET | Freq: Every day | ORAL | Status: DC

## 2018-06-24 MED ORDER — MIDAZOLAM HCL 2 MG/2ML IJ SOLN: 2.0000 mg | INTRAMUSCULAR | Status: DC | PRN

## 2018-06-24 MED ORDER — SODIUM CHLORIDE 0.9 % IV BOLUS (SEPSIS)
1000.0000 mL | Freq: Once | INTRAVENOUS | Status: AC
  Administered 2018-06-24: 1000 mL via INTRAVENOUS

## 2018-06-24 MED ORDER — POTASSIUM CHLORIDE ER 10 MEQ PO TBCR
20.0000 meq | EXTENDED_RELEASE_TABLET | Freq: Every day | ORAL | Status: DC
  Filled 2018-06-24: qty 2

## 2018-06-24 MED ORDER — ROCURONIUM BROMIDE 50 MG/5ML IV SOLN
INTRAVENOUS | Status: AC | PRN
  Administered 2018-06-24: 80 mg via INTRAVENOUS

## 2018-06-24 MED ORDER — ARFORMOTEROL TARTRATE 15 MCG/2ML IN NEBU
15.0000 ug | INHALATION_SOLUTION | Freq: Two times a day (BID) | RESPIRATORY_TRACT | Status: DC
  Filled 2018-06-24: qty 2

## 2018-06-24 MED ORDER — VANCOMYCIN HCL 10 G IV SOLR
1500.0000 mg | Freq: Once | INTRAVENOUS | Status: AC
  Administered 2018-06-24: 1500 mg via INTRAVENOUS
  Filled 2018-06-24: qty 1500

## 2018-06-24 MED ORDER — PHENOBARBITAL SODIUM 65 MG/ML IJ SOLN: 65.0000 mg | Freq: Every day | INTRAMUSCULAR | Status: DC

## 2018-06-24 MED ORDER — ADULT MULTIVITAMIN W/MINERALS CH: 1.0000 | ORAL_TABLET | Freq: Every day | ORAL | Status: DC

## 2018-06-24 MED ORDER — CLONIDINE HCL 0.1 MG PO TABS: 0.1000 mg | ORAL_TABLET | Freq: Three times a day (TID) | ORAL | Status: DC

## 2018-06-24 MED ORDER — POTASSIUM CHLORIDE 20 MEQ PO PACK
20.0000 meq | PACK | Freq: Every day | ORAL | Status: DC
  Administered 2018-06-24: 20 meq
  Filled 2018-06-24: qty 1

## 2018-06-24 MED ORDER — FUROSEMIDE 40 MG PO TABS: 60.0000 mg | ORAL_TABLET | Freq: Every day | ORAL | Status: DC

## 2018-06-24 MED ORDER — SODIUM CHLORIDE 0.9 % IV SOLN: 1000.0000 mL | INTRAVENOUS | Status: DC

## 2018-06-24 MED ORDER — FENTANYL 2500MCG IN NS 250ML (10MCG/ML) PREMIX INFUSION: 0.0000 ug/h | INTRAVENOUS | Status: DC

## 2018-06-24 MED ORDER — FENTANYL CITRATE (PF) 100 MCG/2ML IJ SOLN
100.0000 ug | INTRAMUSCULAR | Status: DC | PRN
  Filled 2018-06-24: qty 2

## 2018-06-24 MED ORDER — PANTOPRAZOLE SODIUM 40 MG IV SOLR: 40.0000 mg | Freq: Every day | INTRAVENOUS | Status: DC

## 2018-06-24 MED ORDER — SERTRALINE HCL 50 MG PO TABS: 50.0000 mg | ORAL_TABLET | Freq: Every day | ORAL | Status: DC

## 2018-06-24 MED ORDER — PRAVASTATIN SODIUM 40 MG PO TABS
80.0000 mg | ORAL_TABLET | Freq: Every day | ORAL | Status: DC
  Administered 2018-06-24: 80 mg
  Filled 2018-06-24: qty 2

## 2018-06-24 MED ORDER — BUDESONIDE 0.5 MG/2ML IN SUSP
0.5000 mg | Freq: Two times a day (BID) | RESPIRATORY_TRACT | Status: DC
  Filled 2018-06-24: qty 2

## 2018-06-24 MED ORDER — LOSARTAN POTASSIUM 50 MG PO TABS: 25.0000 mg | ORAL_TABLET | Freq: Every day | ORAL | Status: DC

## 2018-06-24 MED ORDER — HEPARIN SODIUM (PORCINE) 5000 UNIT/ML IJ SOLN: 5000.0000 [IU] | Freq: Three times a day (TID) | INTRAMUSCULAR | Status: DC

## 2018-06-24 MED ORDER — ALBUTEROL SULFATE (2.5 MG/3ML) 0.083% IN NEBU: 2.5000 mg | INHALATION_SOLUTION | RESPIRATORY_TRACT | Status: DC | PRN

## 2018-06-24 MED ORDER — NOREPINEPHRINE 4 MG/250ML-% IV SOLN
0.0000 ug/min | INTRAVENOUS | Status: DC
  Administered 2018-06-24: 15 ug/min via INTRAVENOUS

## 2018-06-24 MED ORDER — LACTATED RINGERS IV BOLUS
1000.0000 mL | Freq: Once | INTRAVENOUS | Status: AC
  Administered 2018-06-24: 1000 mL via INTRAVENOUS

## 2018-06-24 MED ORDER — MIDAZOLAM HCL 2 MG/2ML IJ SOLN
2.0000 mg | INTRAMUSCULAR | Status: DC | PRN
  Filled 2018-06-24: qty 2

## 2018-06-24 MED ORDER — FENTANYL CITRATE (PF) 100 MCG/2ML IJ SOLN: 100.0000 ug | INTRAMUSCULAR | Status: DC | PRN

## 2018-06-24 MED ORDER — SODIUM CHLORIDE 0.9 % IV SOLN
2.0000 g | Freq: Two times a day (BID) | INTRAVENOUS | Status: DC
  Filled 2018-06-24: qty 2

## 2018-06-24 MED ORDER — LORAZEPAM 2 MG/ML IJ SOLN
2.0000 mg | Freq: Once | INTRAMUSCULAR | Status: AC
  Administered 2018-06-24: 2 mg via INTRAVENOUS
  Filled 2018-06-24 (×2): qty 1

## 2018-06-24 MED ORDER — VANCOMYCIN HCL IN DEXTROSE 750-5 MG/150ML-% IV SOLN
750.0000 mg | Freq: Two times a day (BID) | INTRAVENOUS | Status: DC
  Filled 2018-06-24: qty 150

## 2018-06-25 LAB — LEVETIRACETAM LEVEL: Levetiracetam Lvl: 24.7 ug/mL (ref 10.0–40.0)

## 2018-06-25 LAB — URINE CULTURE: Culture: <10000 — AB

## 2018-06-25 MED FILL — Medication: Qty: 1 | Status: AC

## 2018-06-28 ENCOUNTER — Telehealth: Payer: Self-pay | Admitting: *Deleted

## 2018-06-28 NOTE — Telephone Encounter (Signed)
Received original D/C from [facility]-D/C forwarded to Dr.Yacoub to be signed.

## 2018-06-29 LAB — CULTURE, BLOOD (ROUTINE X 2)
Culture: NO GROWTH
Culture: NO GROWTH
Special Requests: ADEQUATE
Special Requests: ADEQUATE

## 2018-06-30 NOTE — Progress Notes (Signed)
August 27, 2008- Arterial line no pulse, confirmed with CCM okay to place magnet on patient's pacemaker. Levophed gtt turned off at this time.  08-27-16- No heart sounds auscultated by Dorthula Matas, RN and Inez Pilgrim, RN. Time of death 27-Aug-2016

## 2018-06-30 NOTE — Progress Notes (Signed)
Critical ABG results reported to MD. Increased rate to 24 per MD and get ABG in 30 minutes.

## 2018-06-30 NOTE — H&P (Addendum)
NAME:  Ana Collins, MRN:  [id number], DOB:  04/03/1956, LOS: 0 ADMISSION DATE:  2018/07/10, CONSULTATION DATE:  2/24 REFERRING MD:  Dr. Fredderick Phenix, CHIEF COMPLAINT:  Seizure   Brief History   63 y/o F who presented to [facility] on 2/24 from SNF with concern for seizures.  SNF staff concerned patient may have vomited with questionable aspiration.  Seizure medications recently altered.  Recent admit for seizure & UTI.   History of present illness   63 y/o F who presented to [facility] on 2/24 from SNF with concern for seizures.  SNF staff reported patient may have vomited with questionable aspiration.  Seizure medications recently altered but no witnessed seizure at [place].  The patient was recently seen admitted from 2/9-2/17 with seizure & proteus UTI.  She was seen by Palliative Care during that admission.  Patient had UE venous duplex that was positive 06/13/18 for RUE acute DVT of the right axillary vein and right brachial veins. At that time, discussion with Neurology / NSGY felt risk outweighed benefit for anticoagulation.    On admit, the patient was treated with IV keppra.  Unfortunately, she was not protecting her airway and was intubated for airway protection in the ER.  Initial CXR reviewed > rotated, small right effusion, mild blunting of left costophrenic angle, R hilum congested but may be worse in appearance due to rotation.  Initial labs - Na 142, K 3.8, BUN 6, glucose 8.3, AST 75, WBC 6.3, Hgb 11.5 and platelets 174.    PCCM consulted for ICU admission. EDP reportedly called family who requests full code.    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  Off Eliquis. HTN HLD Pacemaker  CHF  AF  CAD  Anemia  GERD Epilepsy  Dementia    Significant Hospital Events   2/24  Admit with concern for seizure, intubated   Consults:  PCCM  Neurology   Procedures:  ETT 2/24 >>   Significant Diagnostic Tests:  CT Head 2/24 >> stable subdural hemorrhage overlying the  cerebral convexity & anterior falx near the vertex, measuring 5 mm greatest thickness without midline shift or other evidence of associated mass effect.  No new abnormality.  Micro Data:  BCx2 2/24 >>  UA 2/24 >>  Tracheal aspirate 2/24 >>   Antimicrobials:  Cefepime 2/24 >>   Vancomycin 2/24>>>  Interim history/subjective:  As above.   Objective   Blood pressure 113/79, pulse 82, resp. rate 18, SpO2 100 %.    Vent Mode: PRVC FiO2 (%):  [100 %] 100 % Set Rate:  [18 bmp] 18 bmp Vt Set:  [500 mL] 500 mL PEEP:  [5 cmH20] 5 cmH20 Plateau Pressure:  [22 cmH20] 22 cmH20  No intake or output data in the 24 hours ending 07-10-18 1443 There were no vitals filed for this visit.  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 Xanax BID on home med reconciliation as BID, hold  for now.   PRN versed, fentanyl RASS Goal: 0 to -1   Acute Respiratory Insufficiency  -in setting of seizure  P: PRVC 8cc/kg as rest mode  Wean PEEP/FiO2 for sats > 90% Follow up ABG in one hour  Follow intermittent CXR  Pulmicort + Brovana  PRN albuterol  Hold home Anoro  Vomiting with Possible Aspiration  P: PRN zofran  Empiric cefepime on admit  Pan culture  Follow CXR   Chronic AF P: Not a candidate for anticoagulation due to SDH  Follow for rate control   HTN HLD  CAD  P: Hold home lasix, cozaar, clonidine, statin 2/24 > plan to start in am / scheduled    RUE DVT  -found 06/13/18  Hx of SDH P: Follow neuro exam  Discussion during prior admit was risk outweighed benefit of anticoagulation for DVT  Recent Proteus UTI  -2/9 admit P: Follow cultures  Empiric abx as above   At Risk Malnutrition  P: Consider TF in am 2/25 if not extubated   Best practice:  Diet: NPO  Pain/Anxiety/Delirium protocol (if indicated): PRN fentanyl / versed  VAP protocol (if indicated): Ordered  DVT prophylaxis: SCD's  GI prophylaxis: protonix  Glucose control: N/A  Mobility: Bed rest  Code Status: Full Code  Family Communication: No family at bedside.  EDP called family prior to intubation and they requested full code.  Disposition: ICU  Labs   CBC: Recent Labs  Lab 06/28/2018 1205  WBC 6.3  NEUTROABS 3.6  HGB 11.5*  HCT 38.9  MCV 92.6  PLT 174    Basic Metabolic Panel: Recent Labs  Lab Jun 28, 2018 1205  NA 142  K 3.8  CL 107  CO2 25  GLUCOSE 100*  BUN 6*  CREATININE 0.82  CALCIUM 8.3*   GFR: CrCl cannot be calculated (Unknown ideal weight.). Recent Labs  Lab 28-Jun-2018 1205  WBC 6.3    Liver Function Tests: Recent Labs  Lab Jun 28, 2018 1205  AST 75*  ALT 24  ALKPHOS 145*  BILITOT 0.9  PROT 7.6  ALBUMIN 2.3*   No results for input(s): LIPASE, AMYLASE in the last 168 hours. No results for input(s): AMMONIA in the last 168 hours.  ABG No results found for: PHART, PCO2ART, PO2ART, HCO3, TCO2, ACIDBASEDEF, O2SAT   Coagulation Profile: No results for input(s): INR, PROTIME in the last 168 hours.  Cardiac Enzymes: No results for input(s): CKTOTAL, CKMB, CKMBINDEX, TROPONINI in the last 168 hours.  HbA1C: No results found for: HGBA1C  CBG: Recent Labs  Lab 06-28-2018 1351  GLUCAP 83    Review of Systems:   Unable to complete as patient is altered on mechanical ventilation.   Past Medical History  She,  has a past medical history of Anemia, Atrial fibrillation (HCC), CHF (congestive heart failure) (HCC), CVD  (cardiovascular disease), Dementia (HCC), Epilepsy (HCC), GERD (gastroesophageal reflux disease), Heart disease, Hyperlipidemia, Hypertension, Pacemaker, and Traumatic subdural hemorrhage (HCC).   Surgical History   No past surgical history on file.   Social History   reports that she has never smoked. She has never used smokeless tobacco. She reports that she does not drink alcohol or use drugs.   Family History   Her family history includes CVA in her mother.   Allergies Allergies  Allergen Reactions  . Latex Other (See Comments)    ON MAR  . Penicillins Other (See Comments)    Did it involve swelling of the face/tongue/throat, SOB, or low BP? Unknown Did it  involve sudden or severe rash/hives, skin peeling, or any reaction on the inside of your mouth or nose? Unknown Did you need to seek medical attention at a hospital or doctor's office? Unknown When did it last happen?Unknown on MAR  If all above answers are "NO", may proceed with cephalosporin use.      Home Medications  Prior to Admission medications   Medication Sig Start Date End Date Taking? Authorizing Provider  acetaminophen (TYLENOL) 325 MG tablet Take 650 mg by mouth 2 (two) times daily.     [provider]  B Complex-C (B-COMPLEX WITH VITAMIN C) tablet Take 1 tablet by mouth daily.    [provider]  carboxymethylcellul-glycerin (REFRESH OPTIVE) 0.5-0.9 % ophthalmic solution Place 1 drop into both eyes 4 (four) times daily.    [provider]  cloNIDine (CATAPRES) 0.1 MG tablet Take 0.1 mg by mouth 3 (three) times daily.    [provider]  donepezil (ARICEPT) 10 MG tablet Take 10 mg by mouth at bedtime.    [provider]  Fe Fum-FA-B Cmp-C-Zn-Mg-Mn-Cu (HEMOCYTE-PLUS PO) Take 1 capsule by mouth 2 (two) times daily.    [provider]  furosemide (LASIX) 20 MG tablet Take 60 mg by mouth daily.     [provider]  Lacosamide 100 MG TABS Take 2  tablets (200 mg total) by mouth 2 (two) times daily. 06/17/18   Marinda Elk, MD  LORazepam (ATIVAN) 0.5 MG tablet Take 1 tablet (0.5 mg total) by mouth 2 (two) times daily. 06/17/18   Marinda Elk, MD  losartan (COZAAR) 25 MG tablet Take 25 mg by mouth daily.     [provider]  Multiple Vitamin (MULTIVITAMIN WITH MINERALS) TABS tablet Take 1 tablet by mouth daily.    [provider]  Nutritional Supplements (RESOURCE 2.0 PO) Take 1 Can by mouth 3 (three) times daily between meals.    [provider]  omeprazole (PRILOSEC) 20 MG capsule Take 20 mg by mouth daily.    [provider]  ondansetron (ZOFRAN) 8 MG tablet Take by mouth every 8 (eight) hours as needed for nausea or vomiting.    [provider]  PHENobarbital (LUMINAL) 64.8 MG tablet Take 64.8 mg by mouth at bedtime.    [provider]  potassium chloride (K-DUR) 10 MEQ tablet Take 20 mEq by mouth daily.    [provider]  pravastatin (PRAVACHOL) 80 MG tablet Take 80 mg by mouth daily.    [provider]  sertraline (ZOLOFT) 50 MG tablet Take 50 mg by mouth daily.    [provider]  sucralfate (CARAFATE) 1 g tablet Take 1 g by mouth 3 (three) times daily as needed Genella Rife).     [provider]  Umeclidinium-Vilanterol (ANORO ELLIPTA IN) Inhale 1 puff into the lungs daily.    [provider]     Critical care time: 30 minutes     Canary Brim, NP-C [facility] Pgr: [phone] or if no answer [phone] 07-13-18, 2:43 PM  Attending Note:  63 year old female with seizure disorder who was recently discharge from [facility] for UTI and seizure and was found to have a DVT but risk of anti-coagulation outweighed the benefit so no treatment was offered.  Her anti-sz medications were adjusted at the time and patient was discharged to have a vomitting episode with suspected aspiration, became hypoxemic and had a  seizure.  Was transferred to [facility] for seizure, in  status and was intubated for airway protection.  PCCM was called to admit.  Neurology consulted and will manage.  PCCM will admit the ICU, start cefepime and vanc, pan culture, EEG, anti-epileptic medications per neuro.  Repeat CT in AM.  PCCM will admit.  The patient is critically ill with multiple organ systems failure and requires high complexity decision making for assessment and support, frequent evaluation and titration of therapies, application of advanced monitoring technologies and extensive interpretation of multiple databases.   Critical Care Time devoted to patient care services described in this note is  40  Minutes. This time reflects time of care of this signee Dr Koren Bound. This critical care time does not reflect procedure time, or teaching time or supervisory time of PA/NP/Med student/Med Resident etc but could involve care discussion time.  Alyson Reedy, M.D. [facility]monary/Critical Care Medicine. Pager: [phone]. After hours pager: [phone].

## 2018-06-30 NOTE — Progress Notes (Signed)
Notified Dr.Yacoub of Lactic Acid 4.2 and hypotension, order for Levophed received.

## 2018-06-30 NOTE — Progress Notes (Signed)
LTM EEG initiated, no skin breakdown noted.

## 2018-06-30 NOTE — Progress Notes (Signed)
Visited with patient's son when he arrived.  Had prayer and he had lots of spiritual support.  His wife is driving to be with him later and they will make arrangements regarding his mother. Staff is welcome to call chaplain when she arrives.  Phebe Colla, Chaplain   Jul 17, 2018 2000  Clinical Encounter Type  Visited With Health care provider;Other (Comment) (son was on the way here)  Visit Type Initial;Code  Referral From Care management  Consult/Referral To Chaplain  Spiritual Encounters  Spiritual Needs Other (Comment)

## 2018-06-30 NOTE — Progress Notes (Addendum)
Multiple calls throughout the day with desaturation and finally a cardiac arrest.  Multiple maneuvers performed and vent changes.  Patient finally arrested.  We were able to establish ROSC but patient is desaturating again and BP is dropping dramatically.  I suspect another arrest will occur.  AC spoke with son.  He is on his way in but I am not sure if patient will be alive long enough for that.  Will continue to attempt to stabilize.  Start levophed for BP support for now but doubt will be effective given acidosis and bilateral pneumonia.  Patient continues to be very unstable.  Son arrived, informed that we are already maxed out on all interventions.  There is nothing further to do.  He is agreeable to a full DNR with no further escalation of care.  The patient is critically ill with multiple organ systems failure and requires high complexity decision making for assessment and support, frequent evaluation and titration of therapies, application of advanced monitoring technologies and extensive interpretation of multiple databases.   Critical Care Time devoted to patient care services described in this note is  60  Minutes. This time reflects time of care of this signee Dr Koren Bound. This critical care time does not reflect procedure time, or teaching time or supervisory time of PA/NP/Med student/Med Resident etc but could involve care discussion time.  Alyson Reedy, M.D. [facility] Pulmonary/Critical Care Medicine. Pager: [phone]. After hours pager: [phone].

## 2018-06-30 NOTE — ED Triage Notes (Signed)
Pt was observed in room at [facility] and Pt had vomited on clothing. The PA at [facility] had concerns for Pt aspiration. Pt was here 06-09-2018  For seizure . No witnessed seizure at [facility].

## 2018-06-30 NOTE — Progress Notes (Addendum)
Pharmacy Antibiotic Note  Ana Collins is a 63 y.o. female admitted on 2018-07-16 with pneumonia.  Pharmacy has been consulted for cefepime dosing.  Plan: Start cefepime 2g IV Q12h Give vancomycin 1,500mg  IV x 1, then start vancomycin 750mg  IV Q12h Monitor clinical picture, renal function F/U C&S, abx deescalation / LOT     No data recorded.  Recent Labs  Lab 16-Jul-2018 1205  WBC 6.3  CREATININE 0.82    CrCl cannot be calculated (Unknown ideal weight.).    Allergies  Allergen Reactions  . Latex Other (See Comments)    ON MAR  . Penicillins Other (See Comments)    Did it involve swelling of the face/tongue/throat, SOB, or low BP? Unknown Did it involve sudden or severe rash/hives, skin peeling, or any reaction on the inside of your mouth or nose? Unknown Did you need to seek medical attention at a hospital or doctor's office? Unknown When did it last happen?Unknown on MAR  If all above answers are "NO", may proceed with cephalosporin use.     Thank you for allowing pharmacy to be a part of this patient's care.  Armandina Stammer 2018-07-16 2:59 PM

## 2018-06-30 NOTE — Consult Note (Addendum)
Neurology Consultation  Reason for Consult: Possible seizure Referring Physician: Fredderick PhenixBelfi  CC: Altered mental status/seizure  History is obtained from: Chart  HPI: Ana BarrenLinda Collins is a 63 y.o. female with history of traumatic subdural hemorrhage, pacemaker, hypertension, hyperlipidemia, GERD, epilepsy, dementia, atrial fibrillation.  Home medications for seizures include Ativan, Vimpat.  Patient lives at an assisted living center.  Per ER staff who called the facility, patient's nurse practitioner walked by noted that the patient was obtunded and had throw up all over her.  Patient was unarousable at that time and they noted that her oxygen saturations were 86%.  She was suctioned and oxygen saturations remained 86.  For that reason EMS was called.  [facility]  At that time patient remained obtunded and required a nonrebreather.  There was question of non-clinical status epilepticus.   1000 mg of Keppra was administered along with 4 mg of Ativan.  intubated for airway protection   ED course labs/CT head/chest x-ray/stat EEG - On 06/04/2018 neurology was consulted for seizures.  At that time patient had abrupt twitching of her right hand which then spread to involve her face.  These features were resolved after Ativan.  Patient was also found to have UTI.  At that time Vimpat 100 mg twice daily was initiated after an initial 200 mg IV load.  Patient was kept on her home dose of Keppra and phenobarbital.  2 EEGs were obtained while hospitalization which did not capture any seizures but did show interictal right posterior quadrant spike discharges and generalized irregular slow activity..  10 days later patient was discharged from the hospital after her level of consciousness improved.  She was discharged on Keppra 1500 mg twice daily, Vimpat 200 mg twice daily, phenobarbital.  Review of systems Unable to obtain due to altered mental status.   Past Medical History:   Diagnosis Date  . Anemia   . Atrial fibrillation (HCC)   . CHF (congestive heart failure) (HCC)    diastolic  . CVD (cardiovascular disease)   . Dementia (HCC)    vascular  . Epilepsy (HCC)   . GERD (gastroesophageal reflux disease)   . Heart disease    chronic ischemic  . Hyperlipidemia   . Hypertension   . Pacemaker   . Traumatic subdural hemorrhage (HCC)    w/o LOC     Family History  Problem Relation Age of Onset  . CVA Mother    Social History:   reports that she has never smoked. She has never used smokeless tobacco. She reports that she does not drink alcohol or use drugs.  Medications  Current Facility-Administered Medications:  .  0.9 %  sodium chloride infusion, , Intravenous, Continuous, Desai, Rahul P, PA-C .  fentaNYL (SUBLIMAZE) injection 100 mcg, 100 mcg, Intravenous, Q15 min PRN, Desai, Rahul P, PA-C .  fentaNYL (SUBLIMAZE) injection 100 mcg, 100 mcg, Intravenous, Q2H PRN, Desai, Rahul P, PA-C .  fentaNYL 2500mcg in NS 250mL (2110mcg/ml) infusion-PREMIX, 0-400 mcg/hr, Intravenous, Continuous, Belfi, Melanie, MD .  heparin injection 5,000 Units, 5,000 Units, Subcutaneous, Q8H, Desai, Rahul P, PA-C .  lacosamide (VIMPAT) tablet 200 mg, 200 mg, Per Tube, BID, Desai, Rahul P, PA-C .  levETIRAcetam (KEPPRA) IVPB 1000 mg/100 mL premix, 1,000 mg, Intravenous, Once, [employer], Claudia J, PA-C .  LORazepam (ATIVAN) injection 2 mg, 2 mg, Intravenous, Once, Gibbons, Claudia J, PA-C .  midazolam (VERSED) injection 2 mg, 2 mg, Intravenous, Q15 min PRN, Desai, Rahul P, PA-C .  midazolam (VERSED) injection 2 mg, 2 mg, Intravenous, Q2H PRN, Desai, Rahul P, PA-C .  pantoprazole (PROTONIX) injection 40 mg, 40 mg, Intravenous, QHS, Desai, Rahul P, PA-C .  PHENObarbital (LUMINAL) injection 65 mg, 65 mg, Intravenous, QHS, Desai, Rahul P, PA-C .  pravastatin (PRAVACHOL) tablet 80 mg, 80 mg, Per Tube, q1800, Desai, Rahul P, PA-C  Current Outpatient Medications:  .  acetaminophen  (TYLENOL) 325 MG tablet, Take 650 mg by mouth 2 (two) times daily. , Disp: , Rfl:  .  B Complex-C (B-COMPLEX WITH VITAMIN C) tablet, Take 1 tablet by mouth daily., Disp: , Rfl:  .  carboxymethylcellul-glycerin (REFRESH OPTIVE) 0.5-0.9 % ophthalmic solution, Place 1 drop into both eyes 4 (four) times daily., Disp: , Rfl:  .  cloNIDine (CATAPRES) 0.1 MG tablet, Take 0.1 mg by mouth 3 (three) times daily., Disp: , Rfl:  .  donepezil (ARICEPT) 10 MG tablet, Take 10 mg by mouth at bedtime., Disp: , Rfl:  .  Fe Fum-FA-B Cmp-C-Zn-Mg-Mn-Cu (HEMOCYTE-PLUS PO), Take 1 capsule by mouth 2 (two) times daily., Disp: , Rfl:  .  furosemide (LASIX) 20 MG tablet, Take 60 mg by mouth daily. , Disp: , Rfl:  .  Lacosamide 100 MG TABS, Take 2 tablets (200 mg total) by mouth 2 (two) times daily., Disp: 60 tablet, Rfl: 1 .  LORazepam (ATIVAN) 0.5 MG tablet, Take 1 tablet (0.5 mg total) by mouth 2 (two) times daily., Disp: 10 tablet, Rfl: 0 .  losartan (COZAAR) 25 MG tablet, Take 25 mg by mouth daily. , Disp: , Rfl:  .  Multiple Vitamin (MULTIVITAMIN WITH MINERALS) TABS tablet, Take 1 tablet by mouth daily., Disp: , Rfl:  .  Nutritional Supplements (RESOURCE 2.0 PO), Take 1 Can by mouth 3 (three) times daily between meals., Disp: , Rfl:  .  omeprazole (PRILOSEC) 20 MG capsule, Take 20 mg by mouth daily., Disp: , Rfl:  .  ondansetron (ZOFRAN) 8 MG tablet, Take by mouth every 8 (eight) hours as needed for nausea or vomiting., Disp: , Rfl:  .  PHENobarbital (LUMINAL) 64.8 MG tablet, Take 64.8 mg by mouth at bedtime., Disp: , Rfl:  .  potassium chloride (K-DUR) 10 MEQ tablet, Take 20 mEq by mouth daily., Disp: , Rfl:  .  pravastatin (PRAVACHOL) 80 MG tablet, Take 80 mg by mouth daily., Disp: , Rfl:  .  sertraline (ZOLOFT) 50 MG tablet, Take 50 mg by mouth daily., Disp: , Rfl:  .  sucralfate (CARAFATE) 1 g tablet, Take 1 g by mouth 3 (three) times daily as needed Genella Rife). , Disp: , Rfl:  .  Umeclidinium-Vilanterol (ANORO  ELLIPTA IN), Inhale 1 puff into the lungs daily., Disp: , Rfl:    Exam: Current vital signs: BP 113/79   Pulse 82   Resp 18   SpO2 100%  Vital signs in last 24 hours: Pulse Rate:  [62-91] 82 (02/24 1439) Resp:  [18-34] 18 (02/24 1439) BP: (95-113)/(56-79) 113/79 (02/24 1439) SpO2:  [94 %-100 %] 100 % (02/24 1439) FiO2 (%):  [100 %] 100 % (02/24 1427)  Physical Exam  Constitutional: Appears well-developed and well-nourished.  Psych: Unable to obtain Eyes: No scleral injection, proptotic HENT: No OP obstrucion Head: Normocephalic.  Cardiovascular: Atrial fibrillation.  Respiratory: Increased effort of breathing GI: Soft.  No distension. There is no tenderness.  Skin: WDI  Neuro: Mental Status: Currently patient is requiring a nonrebreather, does not respond to pain, eyes are proptotic and roving along with a disconjugate. Cranial Nerves:  II: No blink to threat  III,IV, VI: No blink to threat, disconjugate, pupils are equal round and reactive.  Roving eye movements. 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  No spontaneous or purposeful movements of right arm or leg Sensory: No response to noxious stimuli Deep Tendon Reflexes: Depressed throughout Plantars: Mute Cerebellar: Unable to assess  Labs I have reviewed labs in epic and the results pertinent to this consultation are:   CBC    Component Value Date/Time   WBC 6.3 2018-06-30 1205   RBC 4.20 06-30-18 1205   HGB 11.5 (L) 06/30/2018 1205   HGB 13.0 08/09/2014 0224   HCT 38.9 30-Jun-2018 1205   HCT 40.8 08/09/2014 0224   PLT 174 30-Jun-2018 1205   PLT 80 (L) 08/09/2014 0224   MCV 92.6 06-30-2018 1205   MCV 88 08/09/2014 0224   MCH 27.4 June 30, 2018 1205   MCHC 29.6 (L) 06-30-18 1205   RDW 18.9 (H) 2018/06/30 1205   RDW 15.2 (H) 08/09/2014 0224   LYMPHSABS 1.7  Jun 30, 2018 1205   MONOABS 0.8 06/30/2018 1205   EOSABS 0.3 06-30-2018 1205   BASOSABS 0.0 Jun 30, 2018 1205    CMP     Component Value Date/Time   NA 142 2018-06-30 1205   NA 139 08/09/2014 0224   K 3.8 30-Jun-2018 1205   K 4.1 08/09/2014 0224   CL 107 Jun 30, 2018 1205   CL 104 08/09/2014 0224   CO2 25 June 30, 2018 1205   CO2 29 08/09/2014 0224   GLUCOSE 100 (H) Jun 30, 2018 1205   GLUCOSE 100 (H) 08/09/2014 0224   BUN 6 (L) 06/30/18 1205   BUN 19 08/09/2014 0224   CREATININE 0.82 06/30/2018 1205   CREATININE 1.19 (H) 08/09/2014 0224   CALCIUM 8.3 (L) 2018/06/30 1205   CALCIUM 9.2 08/09/2014 0224   PROT 7.6 June 30, 2018 1205   ALBUMIN 2.3 (L) 06-30-18 1205   AST 75 (H) Jun 30, 2018 1205   ALT 24 30-Jun-2018 1205   ALKPHOS 145 (H) 06-30-18 1205   BILITOT 0.9 06/30/2018 1205   GFRNONAA >60 06/30/2018 1205   GFRNONAA 50 (L) 08/09/2014 0224   GFRAA >60 06/30/18 1205   GFRAA 57 (L) 08/09/2014 0224    Lipid Panel  No results found for: CHOL, TRIG, HDL, CHOLHDL, VLDL, LDLCALC, LDLDIRECT   Imaging I have reviewed the images obtained: CT-scan of the brain-stable subacute subdural hemorrhage overlying the cerebral convexity and anterior falx.  No no intracranial abnormalities. LTM-pending Chest x-ray-small pleural effusions bilaterally along with vascular congestion and perihilar edema bilaterally  Felicie Morn PA-C [facility] [phone]  M-F  (9:00 am- 5:00 PM)  2018-06-30, 2:53 PM   Attending addendum Patient seen and examined independently History and physical-agree with documented above Examination: Agree with documented above    Assessment:  63 year old female presenting to hospital secondary to hypoxia and altered mental status.  Because of prior history of seizure activity there is question of seizure activity around this episode but nobody witnessed that.   Patient has recently been to the hospital with similar findings in the setting of UTI.   ED  providers in the hospital did mention some right-sided twitching and gaze although patient had a roving eye movement on our exam. At this time it is unclear if this was a seizure versus aspiration with hypoxia alone.  Impression Evaluate for status epilepticus Evaluate for stroke Evaluate for  aspiration pneumonia Respiratory failure  Recommendations: -Continue with home antiepileptics at this time -LTM EEG-preliminary review with no seizures. - We will make adjustments with antiepileptic medications once reading of LTM and or EEG has been read - Seizure precautions - Evaluate for metabolic and/or infection such as UTI - Management of toxic metabolic derangements including hypoxia, aspiration and blood pressure management per PCCM.  -- Milon Dikes, MD [facility] Pager: [phone] If 7pm to 7am, please call on call as listed on AMION.  CRITICAL CARE ATTESTATION Performed by: Milon Dikes, MD Total critical care time: 30 minutes Critical care time was exclusive of separately billable procedures and treating other patients and/or supervising APPs/Residents/Students Critical care was necessary to treat or prevent imminent or life-threatening deterioration due to toxic metabolic encephalopathy, seizures, status epilepticus. This patient is critically ill and at significant risk for neurological worsening and/or death and care requires constant monitoring. Critical care was time spent personally by me on the following activities: development of treatment plan with patient and/or surrogate as well as nursing, discussions with consultants, evaluation of patient's response to treatment, examination of patient, obtaining history from patient or surrogate, ordering and performing treatments and interventions, ordering and review of laboratory studies, ordering and review of radiographic studies, pulse oximetry, re-evaluation of patient's condition, participation in multidisciplinary  rounds and medical decision making of high complexity in the care of this patient.

## 2018-06-30 NOTE — ED Triage Notes (Signed)
Difficult stick unable to collect Blood cultures

## 2018-06-30 NOTE — Procedures (Signed)
ELECTROENCEPHALOGRAM REPORT   Patient: Ana Collins       Room #: 6P22E EEG No. ID: [id number] Age: 63 y.o.        Sex: female Referring Physician: Wilford Corner Report Date:  Jul 09, 2018        Interpreting Physician: Thana Farr  History: Ana Collins is an 63 y.o. female with a history of seizure presenting with altered mental status  Medications:  Brovana, Maxipime, Catapres, Aricept, Lasix, Vimpat, Cozaar, MVI, Protonix, Phenobarbital, Zoloft, Pravachol, Kdur, Vancomycin, Fentanyl  Conditions of Recording:  This is a 21 channel routine scalp EEG performed with bipolar and monopolar montages arranged in accordance to the international 10/20 system of electrode placement. One channel was dedicated to EKG recording.  The patient is in the intubated and sedated state.  The patient received Ativan prior to the electroencephalogram.  Description:  The background activity is slow and poorly organized.  It consists of a low voltage polymorphic activity that is continuous and diffusely distributed.  Intermittently there are superimposed theta and alpha rhythms noted as well.  No epileptiform activity is noted.   Hyperventilation and intermittent photic stimulation were not performed.  IMPRESSION: This electroencephalogram is characterized by background slowing that is consistent with the patient's medications.  No epileptiform activity is noted.     Thana Farr, MD Neurology [phone] 2018/07/09, 5:54 PM

## 2018-06-30 NOTE — ED Provider Notes (Signed)
[facility] EMERGENCY DEPARTMENT Provider Note   CSN: [id number] Arrival date & time: July 21, 2018  1148    History   Chief Complaint Chief Complaint  Patient presents with  . Aspiration    HPI Ana Collins is a 63 y.o. female with h/o atrial fibrillation rate controlled, stroke, stroke related seizures, SDH, HTN, brought by EMS from SNF for concern of aspiration.  Pt last seen normal around breakfast time, unknown time.  Staff walked by her room and noticed emesis around face, mouth/chest and patient was coughing.  No meds en route. Pt has been forcefully coughing and intermittently responding to commands.  She is able to tell me her name and tells me she is "ok".  Level 5 caveat due to seizures/postictal state. Will need to call facility to obtain further information.       Spoke to [place] NP who confirms she walked by room and noticed patient coughing and with vomit around mouth.  She was suctioned. SpO2 86% despite Xenia per NP. Pt was less responsive.  Per NP pt has subtle seizures but cannot described them to me. She did not see obvious generalized seizures but pt was not arousable to her.   HPI  Past Medical History:  Diagnosis Date  . Anemia   . Atrial fibrillation (HCC)   . CHF (congestive heart failure) (HCC)    diastolic  . CVD (cardiovascular disease)   . Dementia (HCC)    vascular  . Epilepsy (HCC)   . GERD (gastroesophageal reflux disease)   . Heart disease    chronic ischemic  . Hyperlipidemia   . Hypertension   . Pacemaker   . Traumatic subdural hemorrhage (HCC)    w/o LOC    Patient Active Problem List   Diagnosis Date Noted  . Goals of care, counseling/discussion   . Palliative care by specialist   . 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 Abnormal urinalysis 06/09/2018  . Seizure (HCC) 06/09/2018  . Atrial fibrillation, chronic 06/09/2018  . Pacemaker 06/09/2018  . Seizure, late effect of stroke  (HCC) 06/09/2018  . Fever 06/09/2018  . Chronic subdural hematoma (HCC) 06/01/2018  . UTI (urinary tract infection) 06/01/2018  . Hypernatremia 06/01/2018  . Acute encephalopathy 06/01/2018  . Hypotension 06/01/2018  . AKI (acute kidney injury) (HCC) 06/01/2018    No past surgical history on file.   OB History   No obstetric history on file.      Home Medications    Prior to Admission medications   Medication Sig Start Date End Date Taking? Authorizing Provider  acetaminophen (TYLENOL) 325 MG tablet Take 650 mg by mouth 2 (two) times daily.     [provider]  B Complex-C (B-COMPLEX WITH VITAMIN C) tablet Take 1 tablet by mouth daily.    [provider]  carboxymethylcellul-glycerin (REFRESH OPTIVE) 0.5-0.9 % ophthalmic solution Place 1 drop into both eyes 4 (four) times daily.    [provider]  cloNIDine (CATAPRES) 0.1 MG tablet Take 0.1 mg by mouth 3 (three) times daily.    [provider]  donepezil (ARICEPT) 10 MG tablet Take 10 mg by mouth at bedtime.    [provider]  Fe Fum-FA-B Cmp-C-Zn-Mg-Mn-Cu (HEMOCYTE-PLUS PO) Take 1 capsule by mouth 2 (two) times daily.    [provider]  furosemide (LASIX) 20 MG tablet Take 60 mg by mouth daily.     [provider]  Lacosamide 100 MG TABS Take  2 tablets (200 mg total) by mouth 2 (two) times daily. 06/17/18   Marinda ElkFeliz Ortiz, Abraham, MD  LORazepam (ATIVAN) 0.5 MG tablet Take 1 tablet (0.5 mg total) by mouth 2 (two) times daily. 06/17/18   Marinda ElkFeliz Ortiz, Abraham, MD  losartan (COZAAR) 25 MG tablet Take 25 mg by mouth daily.     [provider]  Multiple Vitamin (MULTIVITAMIN WITH MINERALS) TABS tablet Take 1 tablet by mouth daily.    [provider]  Nutritional Supplements (RESOURCE 2.0 PO) Take 1 Can by mouth 3 (three) times daily between meals.    [provider]  omeprazole (PRILOSEC) 20 MG capsule Take 20 mg by mouth daily.    [provider]  ondansetron (ZOFRAN) 8 MG tablet Take by mouth every 8 (eight) hours as needed for nausea or vomiting.    [provider]  PHENobarbital (LUMINAL) 64.8 MG tablet Take 64.8 mg by mouth at bedtime.    [provider]  potassium chloride (K-DUR) 10 MEQ tablet Take 20 mEq by mouth daily.    [provider]  pravastatin (PRAVACHOL) 80 MG tablet Take 80 mg by mouth daily.    [provider]  sertraline (ZOLOFT) 50 MG tablet Take 50 mg by mouth daily.    [provider]  sucralfate (CARAFATE) 1 g tablet Take 1 g by mouth 3 (three) times daily as needed Genella Rife(Gerd).     [provider]  Umeclidinium-Vilanterol (ANORO ELLIPTA IN) Inhale 1 puff into the lungs daily.    [provider]    Family History Family History  Problem Relation Age of Onset  . CVA Mother     Social History Social History   Tobacco Use  . Smoking status: Never Smoker  . Smokeless tobacco: Never Used  Substance Use Topics  . Alcohol use: Never    Frequency: Never  . Drug use: Never     Allergies   Latex and Penicillins   Review of Systems Review of Systems  Respiratory: Positive for cough.   Neurological: Positive for seizures.  All other systems reviewed and are negative.    Physical Exam Updated Vital Signs BP 113/79   Pulse 82   Resp 18   SpO2 100%   Physical Exam Vitals signs and nursing note reviewed.  Constitutional:      General: She is not in acute distress.    Appearance: She is well-developed.     Comments: NAD.  Moaning.  Awake.  HENT:     Head: Normocephalic and atraumatic.     Right Ear: External ear normal.     Left Ear: External ear normal.     Nose: Nose normal.     Mouth/Throat:     Comments: Dried up vomit in the left corner of the mouth.  No intraoral or tongue injury. Eyes:     General: No scleral icterus.    Conjunctiva/sclera: Conjunctivae normal.  Neck:     Musculoskeletal: Normal range of  motion and neck supple.  Cardiovascular:     Rate and Rhythm: Normal rate and regular rhythm.     Heart sounds: Normal heart sounds. No murmur.  Pulmonary:     Effort: Pulmonary effort is normal.     Breath sounds: No wheezing.     Comments: Decreased breath sounds to lower lobes bilaterally.   Abdominal:     General: Abdomen is flat.     Tenderness: There is no abdominal tenderness.  Musculoskeletal: Normal range of motion.  General: No deformity.  Skin:    General: Skin is warm and dry.     Capillary Refill: Capillary refill takes less than 2 seconds.  Neurological:     Mental Status: She is alert.     Comments: Oriented to self only. Left sided weakness, unable to assess. Follows simple commands (open eyes, open mouth).  Good strength in right side, can lift and hold arm and right leg.  PERRL bilaterally. Unable to assess further neuro.   Psychiatric:        Behavior: Behavior normal.        Thought Content: Thought content normal.        Judgment: Judgment normal.      ED Treatments / Results  Labs (all labs ordered are listed, but only abnormal results are displayed) Labs Reviewed  CBC WITH DIFFERENTIAL/PLATELET - Abnormal; Notable for the following components:      Result Value   Hemoglobin 11.5 (*)    MCHC 29.6 (*)    RDW 18.9 (*)    All other components within normal limits  COMPREHENSIVE METABOLIC PANEL - Abnormal; Notable for the following components:   Glucose, Bld 100 (*)    BUN 6 (*)    Calcium 8.3 (*)    Albumin 2.3 (*)    AST 75 (*)    Alkaline Phosphatase 145 (*)    All other components within normal limits  CULTURE, BLOOD (ROUTINE X 2)  CULTURE, BLOOD (ROUTINE X 2)  URINE CULTURE  CULTURE, RESPIRATORY  URINALYSIS, ROUTINE W REFLEX MICROSCOPIC  PHENOBARBITAL LEVEL  LACTIC ACID, PLASMA  LACTIC ACID, PLASMA  LEVETIRACETAM LEVEL  CBG MONITORING, ED    EKG None  Radiology Ct Head Wo Contrast  Result Date: Jul 15, 2018 CLINICAL DATA:   Seizure-like activity, LEFT arm and LEFT eye twitching. EXAM: CT HEAD WITHOUT CONTRAST TECHNIQUE: Contiguous axial images were obtained from the base of the skull through the vertex without intravenous contrast. COMPARISON:  Head CTs dated 06/09/2018 and 06/02/2018 FINDINGS: Brain: Again noted is the subacute subdural hemorrhage overlying the cerebral convexity and anterior falx near the vertex, again 5 mm greatest thickness, without midline shift or other evidence of associated mass effect. There is advanced generalized parenchymal volume loss with commensurate dilatation of the ventricles and sulci. Again noted is chronic encephalomalacia of the RIGHT frontoparietal lobe. Again noted are chronic small vessel ischemic changes within the bilateral periventricular and subcortical white matter regions. There is no parenchymal mass, parenchymal hemorrhage or evidence of parenchymal edema. Ventricles are stable in size and configuration. Vascular: Chronic calcified atherosclerotic changes of the large vessels at the skull base. No unexpected hyperdense vessel. Skull: Normal. Negative for fracture or focal lesion. Sinuses/Orbits: No acute finding. Other: Again noted is thickening of the pituitary infundibulum, as previously described. IMPRESSION: 1. Stable subacute subdural hemorrhage overlying the cerebral convexity and anterior falx near the vertex, measuring 5 mm greatest thickness, without midline shift or other evidence of associated mass effect. 2. No new intracranial abnormality. No parenchymal hemorrhage or evidence of parenchymal edema. 3. Atrophy and chronic ischemic changes, as detailed above. Electronically Signed   By: Bary Richard M.D.   On: 07-15-2018 13:29   Dg Chest Portable 1 View  Result Date: July 15, 2018 CLINICAL DATA:  Intubation. EXAM: PORTABLE CHEST 1 VIEW COMPARISON:  06/09/2018 FINDINGS: Endotracheal tube in the right main bronchus. Recommend withdrawal 4 cm. NG tube enters the stomach with  the tip not visualized. Dual lead pacemaker. Cardiac enlargement. Vascular congestion and perihilar  edema bilaterally. Small pleural effusions bilaterally. IMPRESSION: Endotracheal tube 1 cm distal the carina in the right main bronchus. Recommend withdrawal 4 cm Pulmonary edema. These results were called by telephone at the time of interpretation on Jul 17, 2018 at 2:45 pm to Dr. Shawna Orleans BELFI , who verbally acknowledged these results. Electronically Signed   By: Marlan Palau M.D.   On: 07/17/18 14:45    Procedures .Critical Care Performed by: Liberty Handy, PA-C Authorized by: Liberty Handy, PA-C   Critical care provider statement:    Critical care time (minutes):  45   Critical care was necessary to treat or prevent imminent or life-threatening deterioration of the following conditions: AMS; intubation for airway protection.   Critical care was time spent personally by me on the following activities:  Discussions with consultants, evaluation of patient's response to treatment, examination of patient, ordering and performing treatments and interventions, ordering and review of laboratory studies, ordering and review of radiographic studies, pulse oximetry, re-evaluation of patient's condition, obtaining history from patient or surrogate, review of old charts and development of treatment plan with patient or surrogate   I assumed direction of critical care for this patient from another provider in my specialty: no   Procedure Name: Intubation Date/Time: 2018-07-17 2:59 PM Performed by: Liberty Handy, PA-C Pre-anesthesia Checklist: Patient identified, Emergency Drugs available, Suction available, Patient being monitored and Timeout performed Oxygen Delivery Method: Non-rebreather mask Preoxygenation: Pre-oxygenation with 100% oxygen Induction Type: Rapid sequence Ventilation: Mask ventilation without difficulty Laryngoscope Size: Glidescope and 3 Tube size: 7.5 mm Number of  attempts: 1 Airway Equipment and Method: Video-laryngoscopy Placement Confirmation: ETT inserted through vocal cords under direct vision,  Positive ETCO2,  CO2 detector and Breath sounds checked- equal and bilateral Difficulty Due To: Difficulty was anticipated Future Recommendations: Recommend- induction with short-acting agent, and alternative techniques readily available      (including critical care time)  Medications Ordered in ED Medications  levETIRAcetam (KEPPRA) IVPB 1000 mg/100 mL premix (has no administration in time range)  fentaNYL in NS (44mcg/ml) infusion-PREMIX (has no administration in time range)  pantoprazole (PROTONIX) injection 40 mg (has no administration in time range)  0.9 %  sodium chloride infusion (has no administration in time range)  fentaNYL (SUBLIMAZE) injection 100 mcg (has no administration in time range)  fentaNYL (SUBLIMAZE) injection 100 mcg (has no administration in time range)  midazolam (VERSED) injection 2 mg (has no administration in time range)  midazolam (VERSED) injection 2 mg (has no administration in time range)  lacosamide (VIMPAT) tablet 200 mg (has no administration in time range)  PHENObarbital (LUMINAL) injection 65 mg (has no administration in time range)  pravastatin (PRAVACHOL) tablet 80 mg (has no administration in time range)  ceFEPIme (MAXIPIME) 2 g in sodium chloride 0.9 % 100 mL IVPB (has no administration in time range)  LORazepam (ATIVAN) injection 2 mg (2 mg Intravenous Given 17-Jul-2018 1336)  LORazepam (ATIVAN) injection 2 mg (2 mg Intravenous Given 07/17/2018 1410)  etomidate (AMIDATE) injection (20 mg Intravenous Given 2018-07-17 1417)  rocuronium (ZEMURON) injection (80 mg Intravenous Given 2018/07/17 1422)     Initial Impression / Assessment and Plan / ED Course  I have reviewed the triage vital signs and the nursing notes.  Pertinent labs & imaging results that were available during my care of the patient were  reviewed by me and considered in my medical decision making (see chart for details).         On my initial  exam pt is awake, gives 1 word responses (name, no, yes).  She can follow commands (lift arm, lift right leg, open/close mouth and eyes, stick tongue out).   1425: Pt mentation acutely changes while in ER after initial exam. Pt with recurrent episodes of eye rolling left and right, mouth tapping, decreased responsiveness (no response to noxious stimulus, no protective blinking) despite 4 mg ativan.  SpO2 dropped to 86% on NRB.  I paged neurology who evaluated pt prior to intubation.  Some eye rolling noted and no response to noxious stimulus.  Given depressed respiratory effort possibly from ativan and ongoing keppra, decision made to intubate.  Prior to this I spoke to pt's daughter in law Byrd Hesselbach who confirms pt is DNR. Pt has required intubations for status apparently in [place].     1455: Dr Wilford Corner recommends exclusion of other possible etiologies vs encephalopathy, infection.  CT head w/o changes. Labs unremarkable.  CXR with pulmonary edema, ETT will be repositioned by RT. Pt well sedated currently on vent.  Fentanyl ordered prn.  Discussed with Dr Molli Knock who has accepted pt.   Final Clinical Impressions(s) / ED Diagnoses   Final diagnoses:  Endotracheally intubated    ED Discharge Orders    None       Liberty Handy, PA-C July 22, 2018 1501    Rolan Bucco, MD 07-22-2018 1527

## 2018-06-30 NOTE — Procedures (Signed)
CPR  Code called for PEA arrest.  Patient given epi and CPR started, bicarb given, ROSC established after 15 minutes.  Patient's BP is dropping rapidly and suspect will arrest again.  Son is on the way to the hospital.  Alyson Reedy, M.D. [facility] Pulmonary/Critical Care Medicine. Pager: [phone]. After hours pager: [phone].

## 2018-06-30 NOTE — Procedures (Signed)
Right sided arterial line inserted under sterile conditions emergently without consent due to respiratory and circulatory failure.  Confirmed by wave forms.   Alyson Reedy, M.D. [facility] Pulmonary/Critical Care Medicine. Pager: [phone]. After hours pager: [phone].

## 2018-06-30 DEATH — deceased

## 2018-07-01 ENCOUNTER — Telehealth: Payer: Self-pay

## 2018-07-01 NOTE — Telephone Encounter (Signed)
Received dc back from Doctor Molli Knock.  Called funeral home to let them know dc is ready for pickup. I also faxed a copy to the funeral home per the funeral home request.

## 2018-07-02 ENCOUNTER — Telehealth: Payer: Self-pay | Admitting: Pulmonary Disease

## 2018-07-02 NOTE — Telephone Encounter (Signed)
07/02/2018 Received D/C from Dr. Molli Knock  Faxed a copy to [employer] and called them to pick up original. PWR

## 2018-07-31 NOTE — Death Summary Note (Signed)
DEATH SUMMARY   Patient Details  Name: Ana Collins MRN: [id number] DOB: 11/12/1955  Admission/Discharge Information   Admit Date:  Jul 02, 2018  Date of Death: Date of Death: 07-02-2018  Time of Death: Time of Death: 09-09-16  Length of Stay: 1  Referring Physician: Karna Dupes, MD   Reason(s) for Hospitalization  Seizures  Diagnoses  Preliminary cause of death:   Refractory shock Secondary Diagnoses (including complications and co-morbidities):  Seizure Refractory shock Respiratory failure  Brief Hospital Course (including significant findings, care, treatment, and services provided and events leading to death)  63 y/o F who presented to [facility] on 02-Jul-[facility] from SNF with concern for seizures.  SNF staff reported patient may have vomited with questionable aspiration.  Seizure medications recently altered but no witnessed seizure at [place].  The patient was recently seen admitted from 2/9-2/17 with seizure & proteus UTI.  She was seen by Palliative Care during that admission.  Patient had UE venous duplex that was positive 06/13/18 for RUE acute DVT of the right axillary vein and right brachial veins. At that time, discussion with Neurology / NSGY felt risk outweighed benefit for anticoagulation.    On admit, the patient was treated with IV keppra.  Unfortunately, she was not protecting her airway and was intubated for airway protection in the ER.  Initial CXR reviewed > rotated, small right effusion, mild blunting of left costophrenic angle, R hilum congested but may be worse in appearance due to rotation.  Initial labs - Na 142, K 3.8, BUN 6, glucose 8.3, AST 75, WBC 6.3, Hgb 11.5 and platelets 174.    PCCM consulted for ICU admission. EDP reportedly called family who requests full code.    Multiple calls throughout the day with desaturation and finally a cardiac arrest.  Multiple maneuvers performed and vent changes.  Patient finally arrested.  We were able to establish ROSC but patient  is desaturating again and BP is dropping dramatically.  I suspect another arrest will occur.  AC spoke with son.  He is on his way in but I am not sure if patient will be alive long enough for that.  Will continue to attempt to stabilize.  Start levophed for BP support for now but doubt will be effective given acidosis and bilateral pneumonia.  Patient continues to be very unstable.  Son arrived, informed that we are already maxed out on all interventions.  There is nothing further to do.  He is agreeable to a full DNR with no further escalation of care.  Patient subsequently became asystolic and was declared dead by RN staff.    Pertinent Labs and Studies  Significant Diagnostic Studies Dg Chest 2 View  Result Date: 06/09/2018 CLINICAL DATA:  63 year old female EXAM: CHEST - 2 VIEW COMPARISON:  None. FINDINGS: Cardiomediastinal silhouette unchanged in size and contour. Low lung volumes with central vascular congestion. Interlobular septal thickening. Opacities at the lung bases obscuring the hemidiaphragms and blunting the costophrenic sulcus on the lateral view. No pneumothorax. Unchanged cardiac pacing device. IMPRESSION: Acute CHF with small pleural effusions. Electronically Signed   By: Gilmer Mor D.O.   On: 06/09/2018 14:34   Ct Head Wo Contrast  Result Date: 2018/07/02 CLINICAL DATA:  Seizure-like activity, LEFT arm and LEFT eye twitching. EXAM: CT HEAD WITHOUT CONTRAST TECHNIQUE: Contiguous axial images were obtained from the base of the skull through the vertex without intravenous contrast. COMPARISON:  Head CTs dated 06/09/2018 and 06/02/2018 FINDINGS: Brain: Again noted is the subacute subdural hemorrhage  overlying the cerebral convexity and anterior falx near the vertex, again 5 mm greatest thickness, without midline shift or other evidence of associated mass effect. There is advanced generalized parenchymal volume loss with commensurate dilatation of the ventricles and sulci. Again  noted is chronic encephalomalacia of the RIGHT frontoparietal lobe. Again noted are chronic small vessel ischemic changes within the bilateral periventricular and subcortical white matter regions. There is no parenchymal mass, parenchymal hemorrhage or evidence of parenchymal edema. Ventricles are stable in size and configuration. Vascular: Chronic calcified atherosclerotic changes of the large vessels at the skull base. No unexpected hyperdense vessel. Skull: Normal. Negative for fracture or focal lesion. Sinuses/Orbits: No acute finding. Other: Again noted is thickening of the pituitary infundibulum, as previously described. IMPRESSION: 1. Stable subacute subdural hemorrhage overlying the cerebral convexity and anterior falx near the vertex, measuring 5 mm greatest thickness, without midline shift or other evidence of associated mass effect. 2. No new intracranial abnormality. No parenchymal hemorrhage or evidence of parenchymal edema. 3. Atrophy and chronic ischemic changes, as detailed above. Electronically Signed   By: Bary Richard M.D.   On: 07-14-18 13:29   Ct Head Wo Contrast  Result Date: 06/09/2018 CLINICAL DATA:  63 year old female with seizure like activity today. Recent subdural hematoma suspected. EXAM: CT HEAD WITHOUT CONTRAST TECHNIQUE: Contiguous axial images were obtained from the base of the skull through the vertex without intravenous contrast. COMPARISON:  06/02/2018 and earlier. FINDINGS: Brain: Continued evidence of an isodense right lateral extra-axial collection measuring 5 millimeters (series 5, image 37) stable over this series of recent exams. No associated midline shift or intracranial mass effect. Superimposed cystic encephalomalacia in the right temporal and parietal lobes and confluent right greater than left cerebral white matter hypodensity with ex vacuo appearing enlargement of the right lateral ventricle. Stable gray-white matter differentiation throughout the brain. No  cortically based acute infarct identified. Vascular: Calcified atherosclerosis at the skull base. No suspicious intracranial vascular hyperdensity. Skull: No acute osseous abnormality identified. Sinuses/Orbits: Mild to moderate mucosal thickening in the right frontal and posterior ethmoids is stable. Generally well pneumatized paranasal sinuses otherwise. Stable right mastoid effusion. Right tympanic cavity remains clear. Other: Leftward gaze deviation otherwise negative orbits. No acute scalp soft tissue findings. IMPRESSION: 1. Stable non contrast CT appearance of the brain since 06/01/2018. 2. Suspected isodense right lateral subdural hematoma measuring 5 mm. No intracranial mass effect. 3. Cystic appearing encephalomalacia in the right temporal and parietal lobes with confluent right greater than left cerebral white matter hypodensity. Electronically Signed   By: Odessa Fleming M.D.   On: 06/09/2018 14:39   Dg Chest Port 1 View  Result Date: Jul 14, 2018 CLINICAL DATA:  Clinical decompensation. EXAM: PORTABLE CHEST 1 VIEW COMPARISON:  Chest x-rays from earlier same day. Chest x-ray dated 06/09/2018. FINDINGS: Increased airspace opacity within the LEFT upper lung. Persistent central pulmonary vascular congestion and bilateral interstitial edema. Heart size and mediastinal contours appear stable. Endotracheal tube appears well positioned with tip approximately 1-2 cm above the carina. Enteric tube passes below the diaphragm. No pneumothorax seen. IMPRESSION: 1. Increased airspace opacity within the LEFT upper lung. This could represent aspiration or asymmetric pulmonary edema. 2. Persistent central pulmonary vascular congestion and bilateral interstitial edema suggesting some degree of volume overload/CHF. 3. Support apparatus appears appropriately positioned. Electronically Signed   By: Bary Richard M.D.   On: 2018-07-14 18:10   Dg Chest Portable 1 View  Result Date: 07-14-18 CLINICAL DATA:  Inhibition EXAM:  PORTABLE CHEST 1  VIEW COMPARISON:  Chest x-ray from earlier same day. FINDINGS: Endotracheal tube has been retracted and now appears well positioned with tip just above the level of the carina. Enteric tube passes below the diaphragm. LEFT chest wall pacemaker/ICD leads appear stable in position. Heart size and mediastinal contours appear stable. Again noted is bilateral perihilar opacities compatible with pulmonary edema. Small LEFT pleural effusion. No pneumothorax seen. IMPRESSION: 1. Endotracheal tube has been retracted and now appears well positioned with tip just above the level of the carina. 2. Persistent pulmonary edema and small LEFT pleural effusion. Electronically Signed   By: Bary Richard M.D.   On: 07-10-18 15:21   Dg Chest Portable 1 View  Result Date: 07/10/2018 CLINICAL DATA:  Intubation. EXAM: PORTABLE CHEST 1 VIEW COMPARISON:  06/09/2018 FINDINGS: Endotracheal tube in the right main bronchus. Recommend withdrawal 4 cm. NG tube enters the stomach with the tip not visualized. Dual lead pacemaker. Cardiac enlargement. Vascular congestion and perihilar edema bilaterally. Small pleural effusions bilaterally. IMPRESSION: Endotracheal tube 1 cm distal the carina in the right main bronchus. Recommend withdrawal 4 cm Pulmonary edema. These results were called by telephone at the time of interpretation on 07-10-2018 at 2:45 pm to Dr. Shawna Orleans BELFI , who verbally acknowledged these results. Electronically Signed   By: Marlan Palau M.D.   On: Jul 10, 2018 14:45   Dg Swallowing Func-speech Pathology  Result Date: 06/12/2018 Objective Swallowing Evaluation: Type of Study: MBS-Modified Barium Swallow Study  Patient Details Name: Ana Collins MRN: [id number] Date of Birth: 17-Nov-1955 Today's Date: 06/12/2018 Time: SLP Start Time (ACUTE ONLY): 1425 -SLP Stop Time (ACUTE ONLY): 1446 SLP Time Calculation (min) (ACUTE ONLY): 21 min Past Medical History: Past Medical History: Diagnosis Date . Anemia  . Atrial  fibrillation (HCC)  . CHF (congestive heart failure) (HCC)   diastolic . CVD (cardiovascular disease)  . Dementia (HCC)   vascular . Epilepsy (HCC)  . GERD (gastroesophageal reflux disease)  . Heart disease   chronic ischemic . Hyperlipidemia  . Hypertension  . Pacemaker  . Traumatic subdural hemorrhage (HCC)   w/o LOC Past Surgical History: No past surgical history on file. HPI: Pt is a 63 year old female with past history of CVA, right subdural hematoma and epilepsy.  Patient already on 3 medications Keppra, phenobarbital and Vimpat. Admitted because she had a breakthrough seizure likely in the setting of urinary tract infection. On 2/11/2 patient seem more somnolent and was having quasi-rhythmic jerking/twitching right upper extremity. An EEG was obtained and was negative for epileptiform activity and there was no clinical correlate for right upper extremity movement. A dysphagia 1 diet with thin liquids was recommended on 06/10/18 but was downgraded to nectar thick liquids by nursing due to observance of signs of aspiration with thin liquids.   Subjective: pt awake in bed. daughter in law present Assessment / Plan / Recommendation CHL IP CLINICAL IMPRESSIONS 06/12/2018 Clinical Impression Pt presents with moderate oropharyngeal dysphagia characterized by a moderate-severe oral transit delay, impaired bolus control which resulted in premature spillage, and a pharyngeal delay. No penetration/aspiration were noted during the study; however, considering her variable alertness combined with her oropharyngeal delay, her risk of aspiration before deglutition is high with thin liquids. It is therefore recommended that a dysphagia 1 diet with nectar thick liquids be continued and SLP will continue to follow pt.  SLP Visit Diagnosis Dysphagia, oropharyngeal phase (R13.12) Attention and concentration deficit following -- Frontal lobe and executive function deficit following -- Impact on safety and function Mild  aspiration  risk;Moderate aspiration risk   CHL IP TREATMENT RECOMMENDATION 06/12/2018 Treatment Recommendations Therapy as outlined in treatment plan below   Prognosis 06/12/2018 Prognosis for Safe Diet Advancement Fair Barriers to Reach Goals Cognitive deficits;Severity of deficits Barriers/Prognosis Comment -- CHL IP DIET RECOMMENDATION 06/12/2018 SLP Diet Recommendations Dysphagia 1 (Puree) solids;Nectar thick liquid Liquid Administration via Cup;Straw Medication Administration Whole meds with puree Compensations Minimize environmental distractions;Slow rate;Small sips/bites Postural Changes Remain semi-upright after after feeds/meals (Comment)   CHL IP OTHER RECOMMENDATIONS 06/12/2018 Recommended Consults -- Oral Care Recommendations Oral care BID Other Recommendations Have oral suction available   CHL IP FOLLOW UP RECOMMENDATIONS 06/12/2018 Follow up Recommendations Skilled Nursing facility   [facility] IP FREQUENCY AND DURATION 06/12/2018 Speech Therapy Frequency (ACUTE ONLY) min 2x/week Treatment Duration 2 weeks      CHL IP ORAL PHASE 06/12/2018 Oral Phase Impaired Oral - Pudding Teaspoon -- Oral - Pudding Cup -- Oral - Honey Teaspoon -- Oral - Honey Cup -- Oral - Nectar Teaspoon -- Oral - Nectar Cup -- Oral - Nectar Straw Delayed oral transit;Premature spillage Oral - Thin Teaspoon -- Oral - Thin Cup Delayed oral transit;Premature spillage Oral - Thin Straw -- Oral - Puree Delayed oral transit;Premature spillage Oral - Mech Soft Impaired mastication;Delayed oral transit;Premature spillage Oral - Regular -- Oral - Multi-Consistency -- Oral - Pill -- Oral Phase - Comment --  CHL IP PHARYNGEAL PHASE 06/12/2018 Pharyngeal Phase Impaired Pharyngeal- Pudding Teaspoon -- Pharyngeal -- Pharyngeal- Pudding Cup -- Pharyngeal -- Pharyngeal- Honey Teaspoon -- Pharyngeal -- Pharyngeal- Honey Cup -- Pharyngeal -- Pharyngeal- Nectar Teaspoon -- Pharyngeal -- Pharyngeal- Nectar Cup -- Pharyngeal -- Pharyngeal- Nectar Straw Delayed swallow  initiation-vallecula Pharyngeal -- Pharyngeal- Thin Teaspoon -- Pharyngeal -- Pharyngeal- Thin Cup -- Pharyngeal -- Pharyngeal- Thin Straw Delayed swallow initiation-pyriform sinuses Pharyngeal -- Pharyngeal- Puree Delayed swallow initiation-vallecula Pharyngeal -- Pharyngeal- Mechanical Soft Delayed swallow initiation-vallecula Pharyngeal -- Pharyngeal- Regular -- Pharyngeal -- Pharyngeal- Multi-consistency -- Pharyngeal -- Pharyngeal- Pill -- Pharyngeal -- Pharyngeal Comment --  CHL IP CERVICAL ESOPHAGEAL PHASE 06/12/2018 Cervical Esophageal Phase WFL Pudding Teaspoon -- Pudding Cup -- Honey Teaspoon -- Honey Cup -- Nectar Teaspoon -- Nectar Cup -- Nectar Straw -- Thin Teaspoon -- Thin Cup -- Thin Straw -- Puree -- Mechanical Soft -- Regular -- Multi-consistency -- Pill -- Cervical Esophageal Comment -- Ana I. Vear Clock, MS, CCC-SLP Acute Rehabilitation Services Office number [phone] Pager [phone] Scheryl Marten 06/12/2018, 4:22 PM              Vas [place] Upper Extremity Venous Duplex  Result Date: 06/13/2018 UPPER VENOUS STUDY  Indications: Swelling Limitations: Body habitus, poor ultrasound/tissue interface and patient contracted on left side. Performing Technologist: Gertie Fey MHA, RDMS, RVT, RDCS  Examination Guidelines: A complete evaluation includes B-mode imaging, spectral Doppler, color Doppler, and power Doppler as needed of all accessible portions of each vessel. Bilateral testing is considered an integral part of a complete examination. Limited examinations for reoccurring indications may be performed as noted.  Right Findings: +----------+------------+----------+---------+-----------+--------------+ RIGHT     CompressiblePropertiesPhasicitySpontaneous   Summary     +----------+------------+----------+---------+-----------+--------------+ IJV           Full                 Yes       Yes                    +----------+------------+----------+---------+-----------+--------------+ Subclavian    Full  Yes       Yes                   +----------+------------+----------+---------+-----------+--------------+ Axillary      None                           No         Acute      +----------+------------+----------+---------+-----------+--------------+ Brachial      None                           No         Acute      +----------+------------+----------+---------+-----------+--------------+ Radial        Full                                                 +----------+------------+----------+---------+-----------+--------------+ Ulnar                                               Not visualized +----------+------------+----------+---------+-----------+--------------+ Cephalic      Full                                                 +----------+------------+----------+---------+-----------+--------------+ Basilic                                             Not visualized +----------+------------+----------+---------+-----------+--------------+  Left Findings: +----------+------------+----------+---------+-----------+--------------+ LEFT      CompressiblePropertiesPhasicitySpontaneous   Summary     +----------+------------+----------+---------+-----------+--------------+ IJV                                                 Not visualized +----------+------------+----------+---------+-----------+--------------+ Subclavian                                          Not visualized +----------+------------+----------+---------+-----------+--------------+ Axillary                                            Not visualized +----------+------------+----------+---------+-----------+--------------+ Brachial      Full                 Yes       Yes                   +----------+------------+----------+---------+-----------+--------------+ Radial                                               Not visualized +----------+------------+----------+---------+-----------+--------------+  Ulnar                                               Not visualized +----------+------------+----------+---------+-----------+--------------+ Cephalic      None                                      Acute      +----------+------------+----------+---------+-----------+--------------+ Basilic                                             Not visualized +----------+------------+----------+---------+-----------+--------------+  Summary:  Right: Findings consistent with acute deep vein thrombosis involving the right axillary vein and right brachial veins.  Left: No evidence of deep vein thrombosis in the upper extremity involving the visualized veins of the left upper extremity. Findings consistent with acute superficial vein thrombosis involving the left cephalic vein. This was a limited study.  *See table(s) above for measurements and observations.  Diagnosing physician: Sherald Hess MD Electronically signed by Sherald Hess MD on 06/13/2018 at 2:55:47 PM.    Final     Microbiology Recent Results (from the past 240 hour(s))  Urine culture     Status: Abnormal   Collection Time: Jul 12, 2018  2:47 PM  Result Value Ref Range Status   Specimen Description URINE, CATHETERIZED  Final   Special Requests NONE  Final   Culture (A)  Final    <10,000 COLONIES/mL INSIGNIFICANT GROWTH Performed at [facility], [address], [place], [place] [address]    Report Status 06/25/2018 FINAL  Final  Blood culture (routine x 2)     Status: None   Collection Time: 2018-07-12  4:46 PM  Result Value Ref Range Status   Specimen Description BLOOD RT BREAST  Final   Special Requests   Final    BOTTLES DRAWN AEROBIC ONLY Blood Culture adequate volume   Culture   Final    NO GROWTH 5 DAYS Performed at [facility], [address] [place], [place]  [address]    Report Status 06/29/2018 FINAL  Final  Blood culture (routine x 2)     Status: None   Collection Time: 2018/07/12  4:54 PM  Result Value Ref Range Status   Specimen Description BLOOD BREAST  Final   Special Requests   Final    BOTTLES DRAWN AEROBIC ONLY Blood Culture adequate volume   Culture   Final    NO GROWTH 5 DAYS Performed at [facility], [address], [place], [place] [address]    Report Status 06/29/2018 FINAL  Final    Lab Basic Metabolic Panel: No results for input(s): NA, K, CL, CO2, GLUCOSE, BUN, CREATININE, CALCIUM, MG, PHOS in the last 168 hours. Liver Function Tests: No results for input(s): AST, ALT, ALKPHOS, BILITOT, PROT, ALBUMIN in the last 168 hours. No results for input(s): LIPASE, AMYLASE in the last 168 hours. No results for input(s): AMMONIA in the last 168 hours. CBC: No results for input(s): WBC, NEUTROABS, HGB, HCT, MCV, PLT in the last 168 hours. Cardiac Enzymes: No results for input(s): CKTOTAL, CKMB, CKMBINDEX, TROPONINI in the last 168 hours. Sepsis Labs: No results for input(s): PROCALCITON,  WBC, LATICACIDVEN in the last 168 hours.  Procedures/Operations     Ana Collins 07/04/2018, 7:59 AM

## 2020-08-01 IMAGING — US US RENAL
1 series · 14 of 25 positions shown · non-contrast
Comparison: None.

CLINICAL DATA: Acute kidney injury.

EXAM:
RENAL / URINARY TRACT ULTRASOUND COMPLETE

[Series 1: us renal · 14 of 39 slices shown]
[im 1/39]
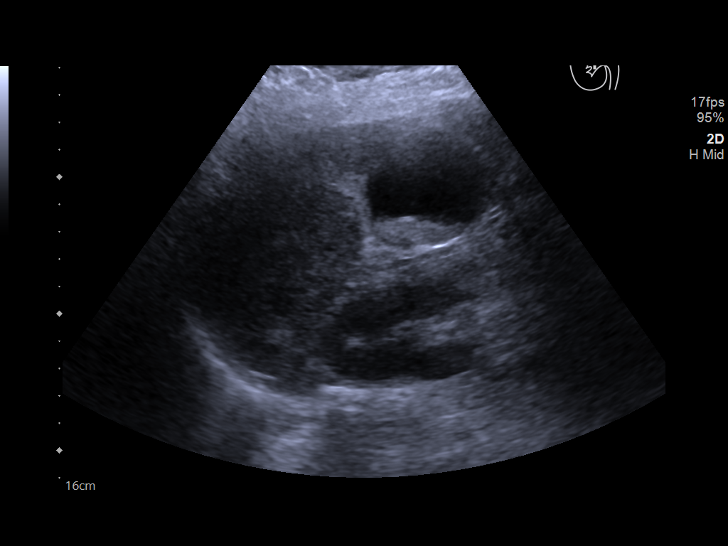
[im 4/39]
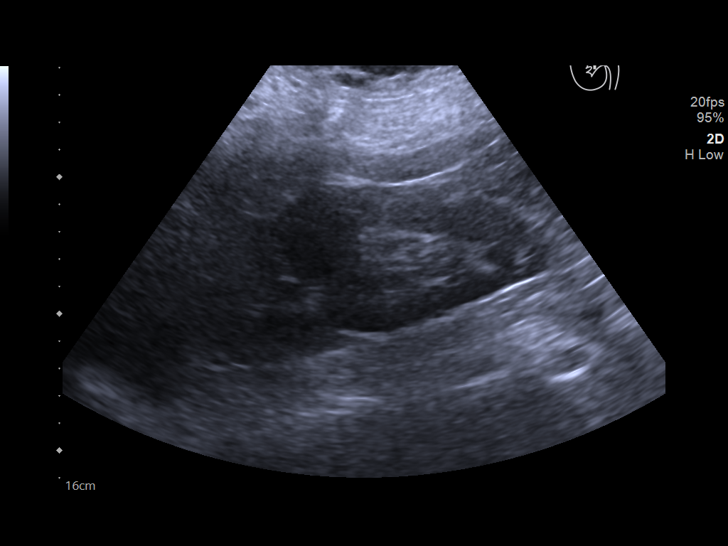
[im 7/39]
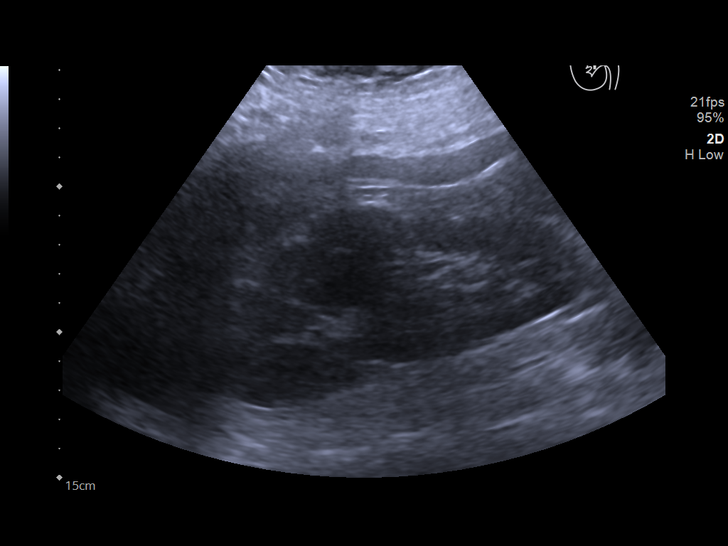
[im 10/39]
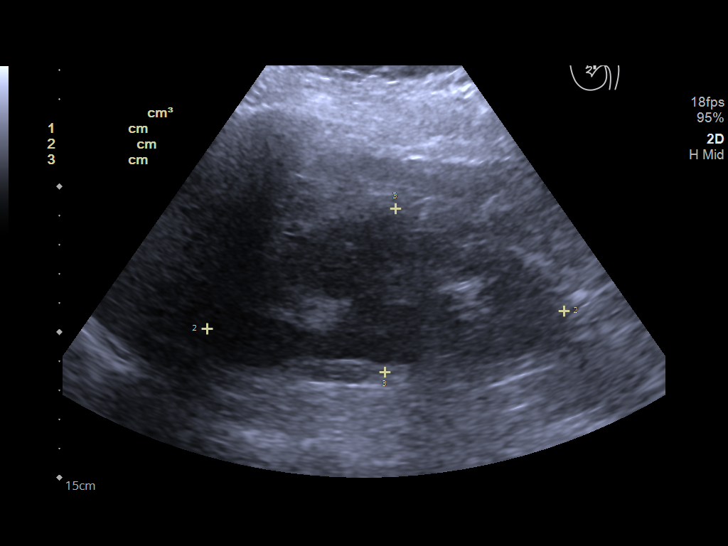
[im 13/39]
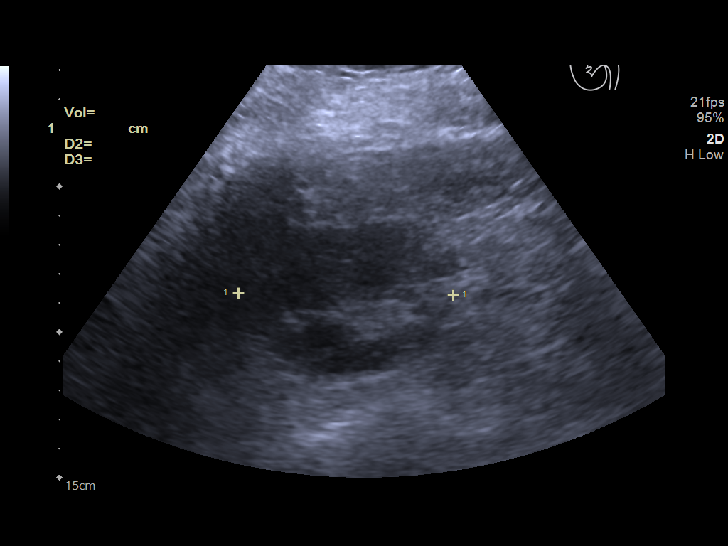
[im 15/39]
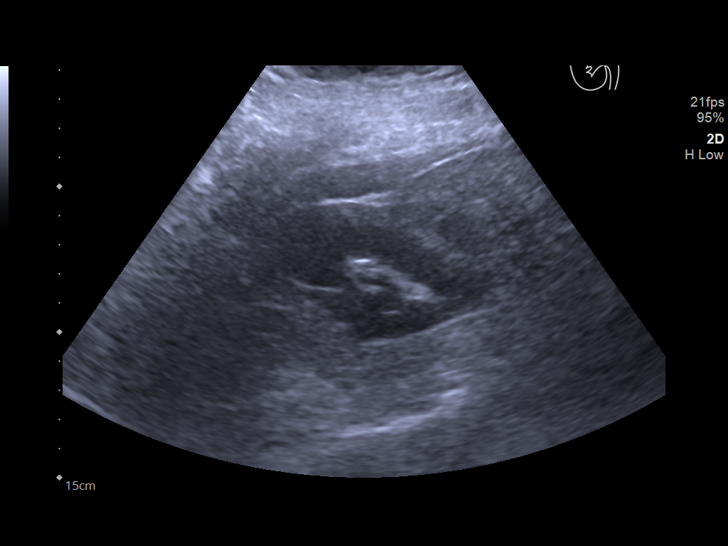
[im 18/39]
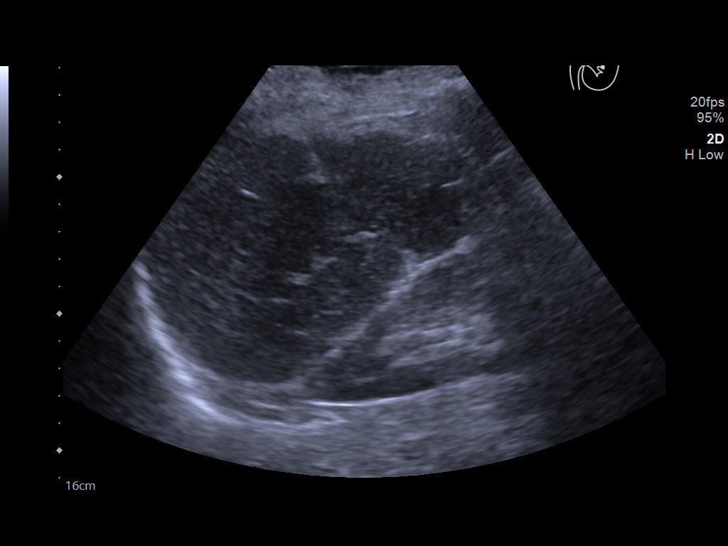
[im 21/39]
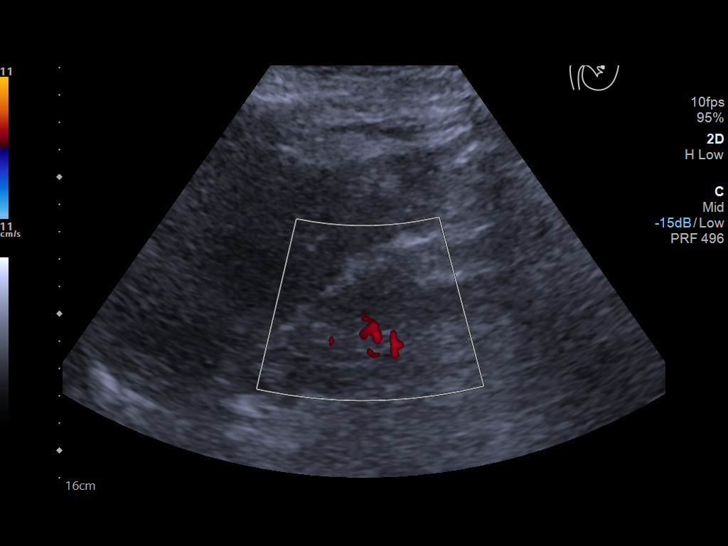
[im 24/39]
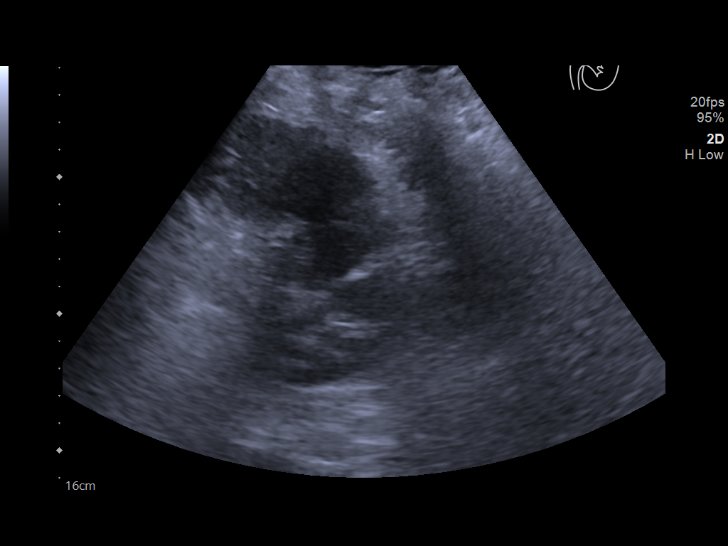
[im 26/39]
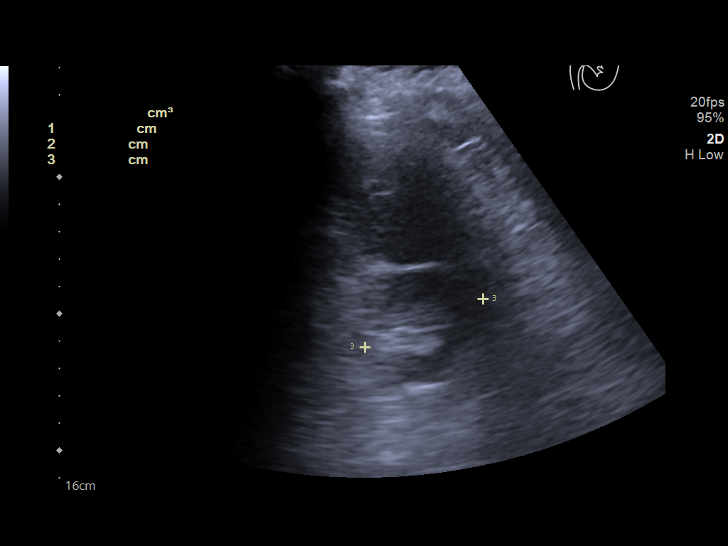
[im 29/39]
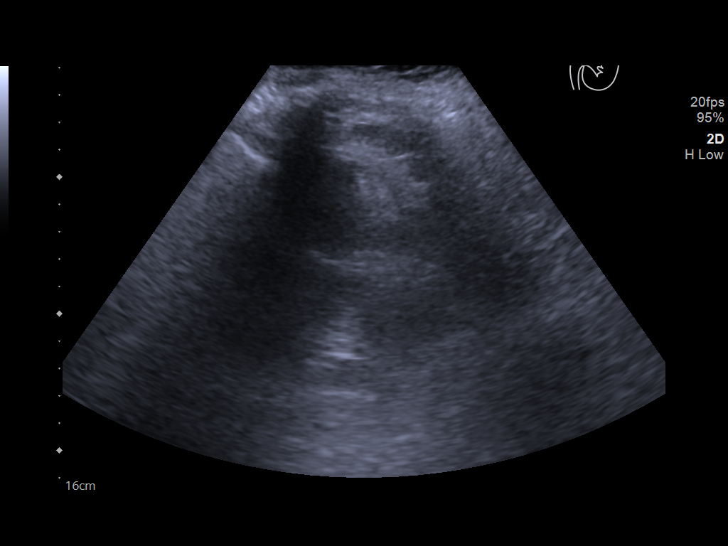
[im 32/39]
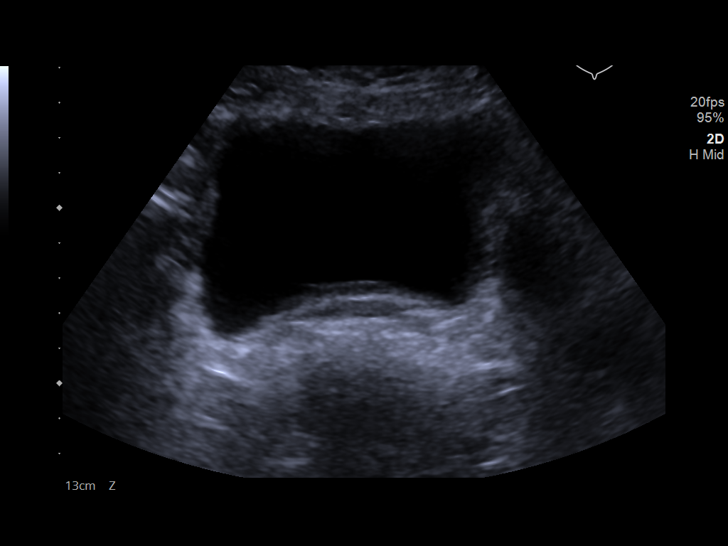
[im 35/39]
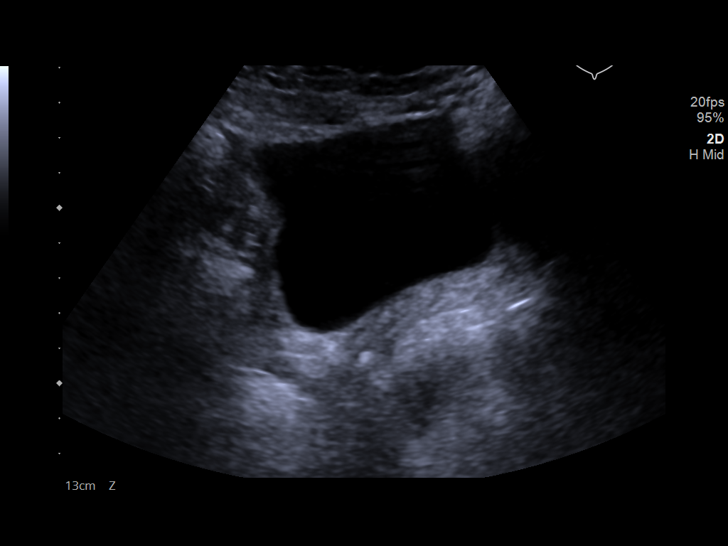
[im 39/39]
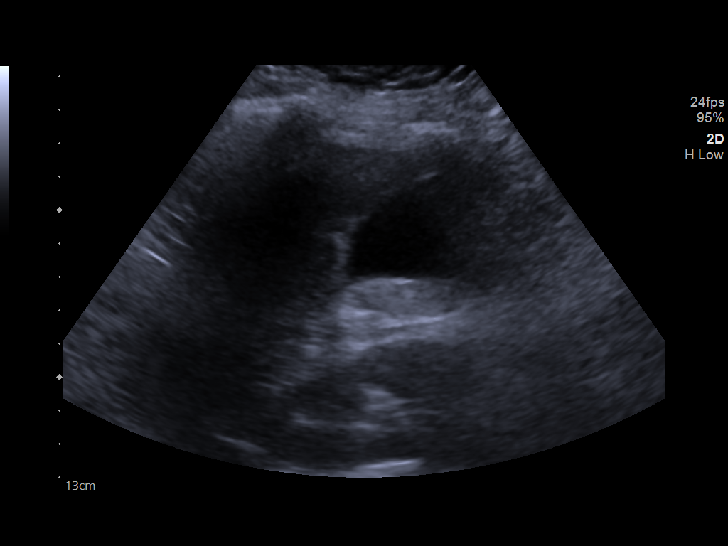

[14 of 25 positions shown; findings below may reference images not displayed]

FINDINGS: Right Kidney:

Renal measurements: 12.3 x 5.6 x 7.4 cm = volume: 267 mL .
Echogenicity within normal limits. No mass or hydronephrosis
visualized.

Left Kidney:

Renal measurements: 10.5 x 4.7 x 4.4 cm = volume: 112 mL.
Echogenicity within normal limits. No mass or hydronephrosis
visualized.

Bladder:

Appears normal for degree of bladder distention.
IMPRESSION: No significant renal abnormality seen.

## 2020-08-01 IMAGING — CT CT HEAD W/O CM
3 of 4 series · 13 of 47 positions shown, 15 images · non-contrast
Comparison: None.

CLINICAL DATA: 62-year-old female with hypotension and altered
mental status.

EXAM:
CT HEAD WITHOUT CONTRAST
TECHNIQUE: Contiguous axial images were obtained from the base of the skull
through the vertex without intravenous contrast.

[Series 3: head wo · axial · 0.44mm/px · z∈[-141,-11]mm · 7 of 36 slices shown, 9 images]
[im 5/36  brain]
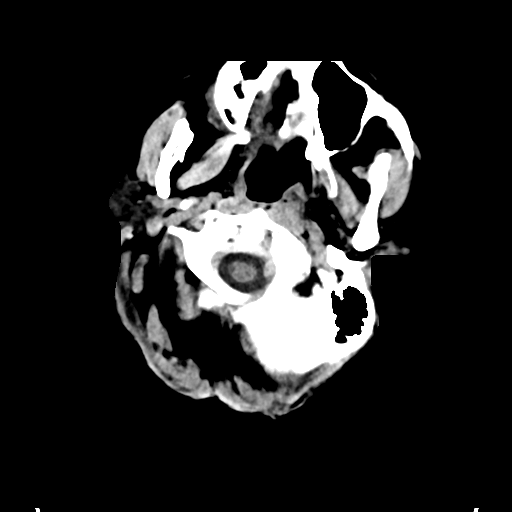
[im 5/36  bone]
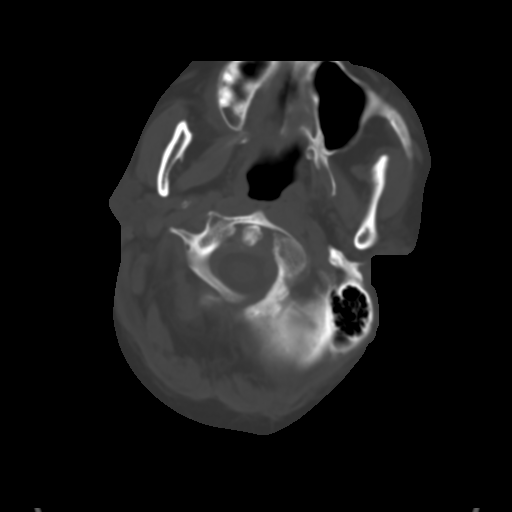
[im 9/36  brain]
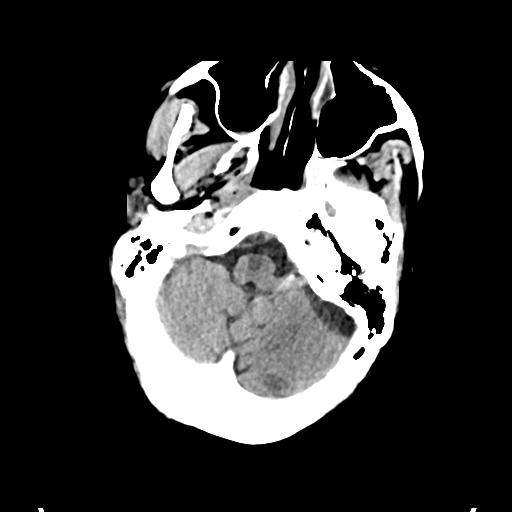
[im 14/36  brain]
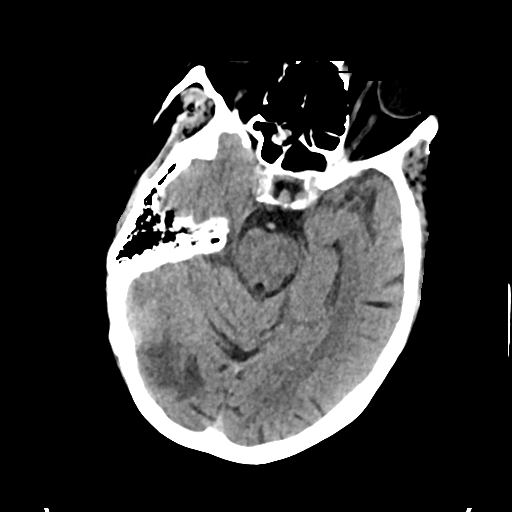
[im 18/36  brain]
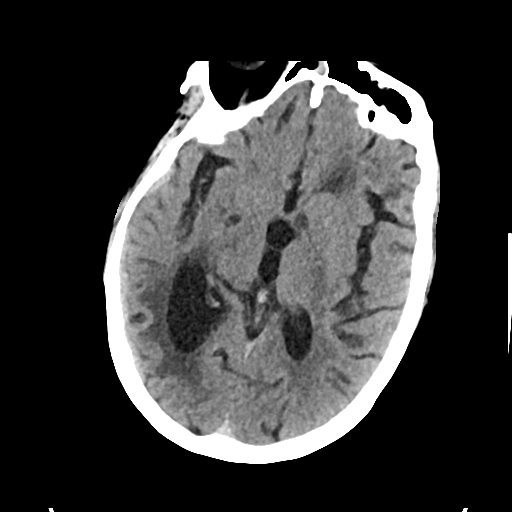
[im 22/36  brain]
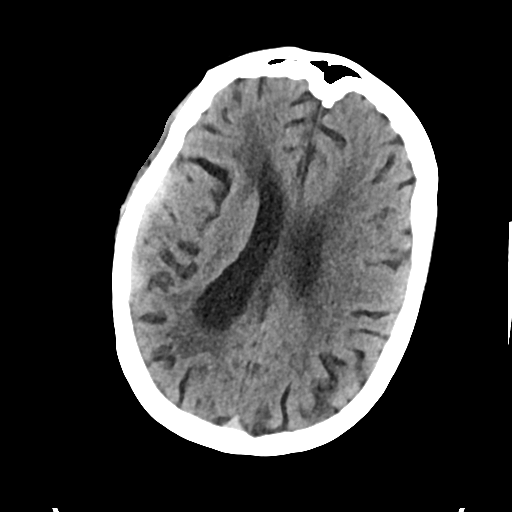
[im 22/36  bone]
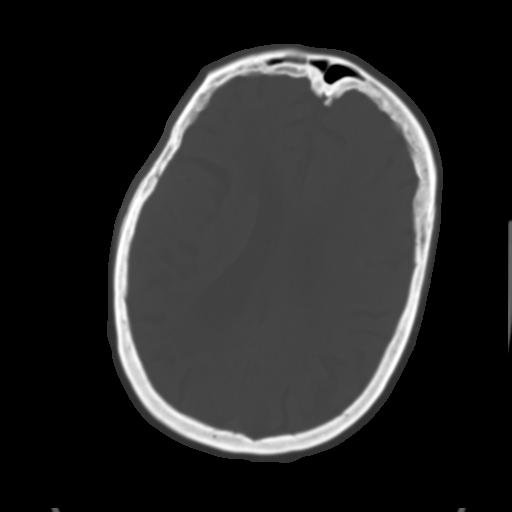
[im 27/36  brain]
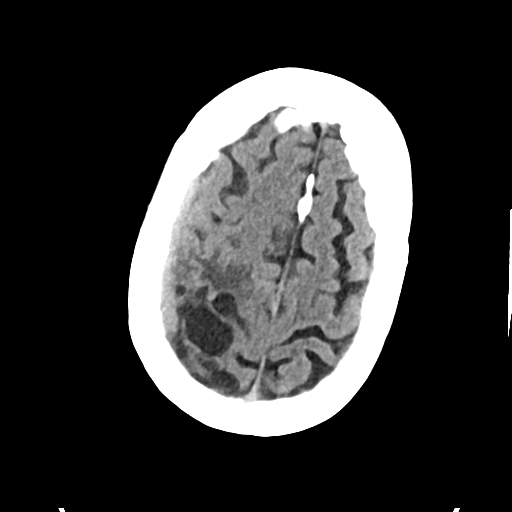
[im 31/36  brain]
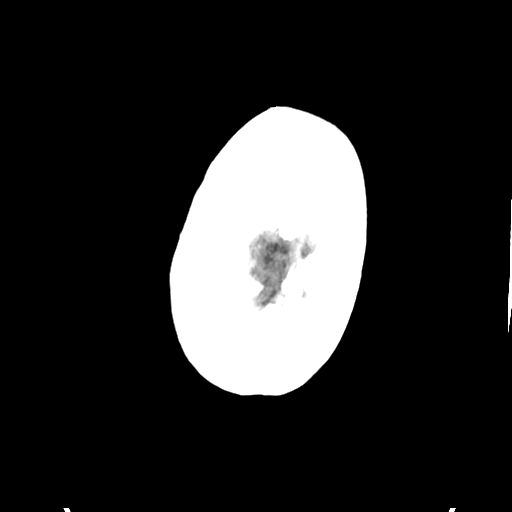

[Series 5: cor soft · coronal · 0.34mm/px · 3 of 75 slices shown]
[im 25/75  brain]
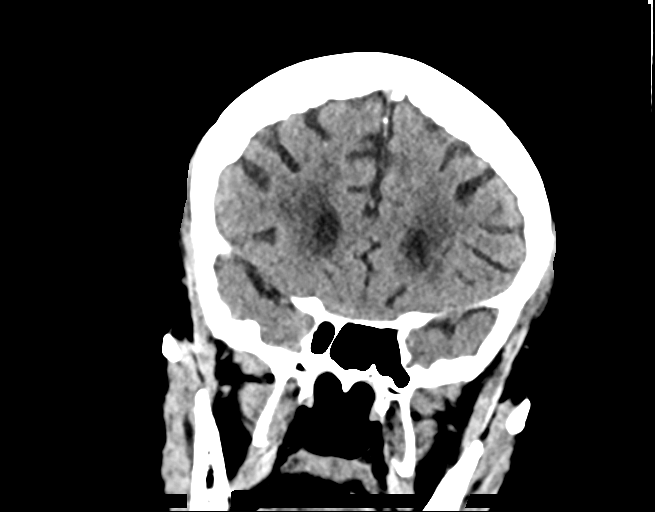
[im 33/75  brain]
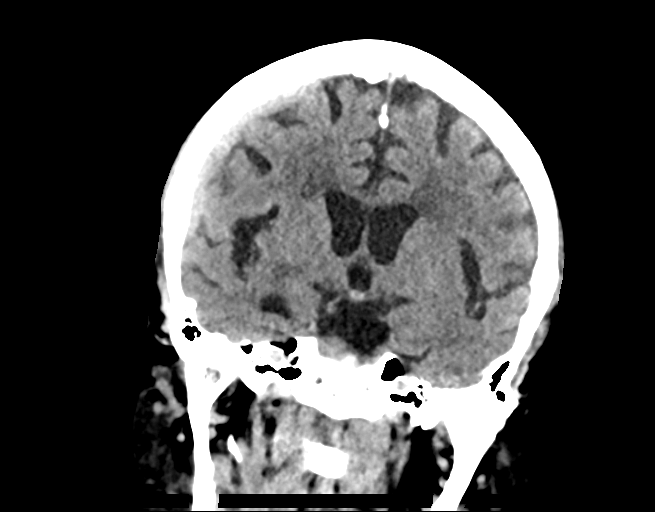
[im 42/75  brain]
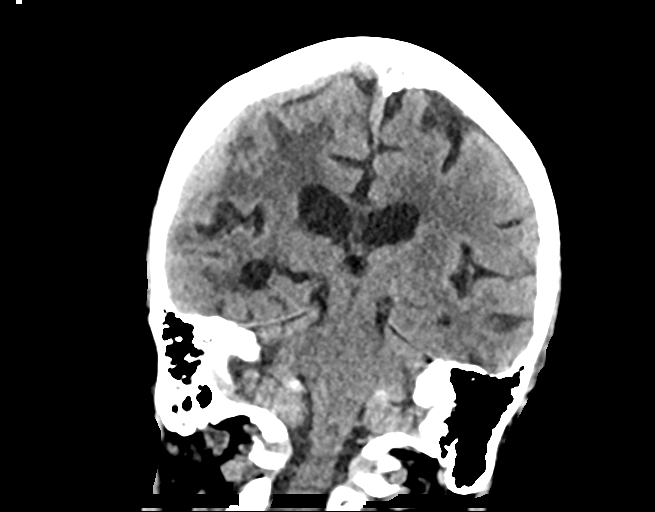

[Series 6: sag soft · sagittal · 0.34mm/px · 3 of 66 slices shown]
[im 22/66  brain]
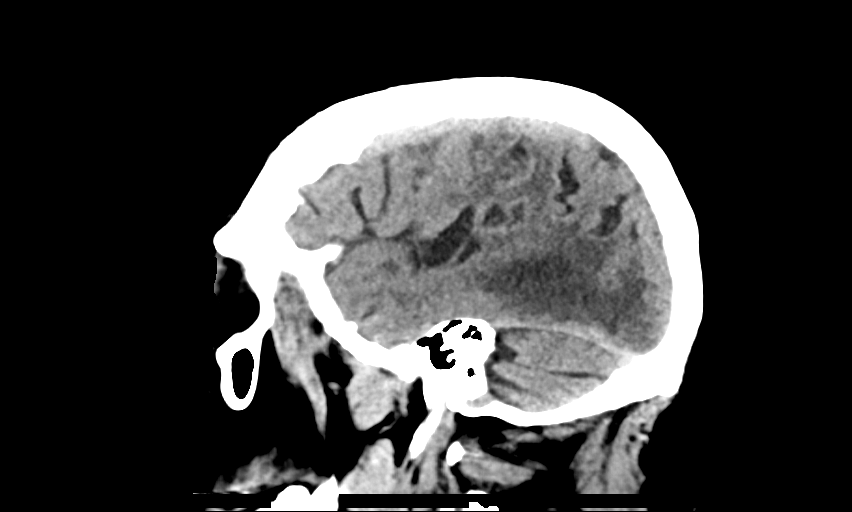
[im 33/66  brain]
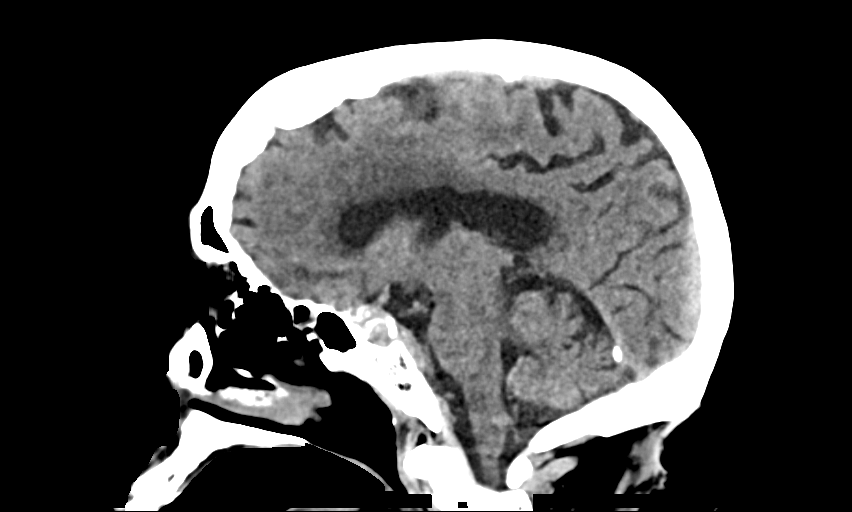
[im 44/66  brain]
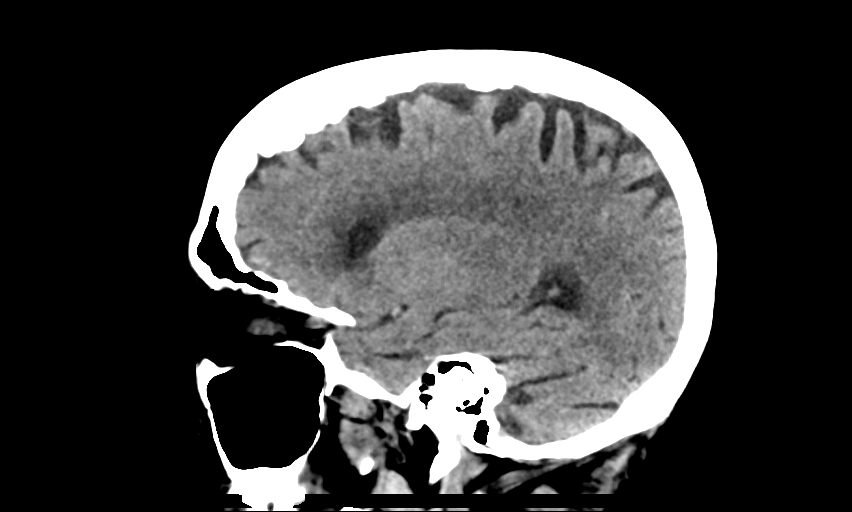

[13 of 47 positions shown; findings below may reference images not displayed]

FINDINGS: Brain: Acute right-sided subdural overlying the convexity the brain
measuring up to 4.3 mm in thickness. Right parafalcine subdural
hematoma is also noted measuring to 3.5 mm in thickness as well as
the right tentorium measuring up to 1.4 mm.

Remote appearing right temporal occipital infarct with adjacent
encephalomalacia right lateral ventricle. An age-indeterminate
brainstem infarct is also seen. Hyperdensities along the
cerebellopontine angles compatible choroid plexus calcifications the
sagittal view. Moderate degree of small vessel ischemia is otherwise
noted. Age related involutional changes of brain are noted.

Vascular: No hyperdense vessel sign.

Skull: No acute fracture or suspicious osseous lesions.

Sinuses/Orbits: Right frontal, right maxillary and ethmoid sinus
mucosal thickening. Intact orbits and globes.

Other: None
IMPRESSION: 1. Acute right-sided subdural hematomas as above along the convexity
of the brain, falx and right tentorium measuring up to 4.3 mm in
thickness.
2. Chronic moderate small vessel ischemic disease.
3. Chronic right temporal occipital lobe infarct with adjacent
encephalomalacia the right lateral ventricle.
4. Age-indeterminate brainstem infarct.

These results were called by telephone at the time of interpretation
on 06/01/2018 at [DATE] to Dr. SAMIRNA AHUMADA , who verbally
acknowledged these results.
